# Patient Record
Sex: Female | Born: 1960 | Race: White | Hispanic: No | Marital: Married | Smoking: Never smoker
Health system: Southern US, Community
[De-identification: ages and names within clinical notes are randomized; demographics above are authoritative.]

## PROBLEM LIST (undated history)

## (undated) DIAGNOSIS — M720 Palmar fascial fibromatosis [Dupuytren]: Secondary | ICD-10-CM

## (undated) DIAGNOSIS — B3731 Acute candidiasis of vulva and vagina: Secondary | ICD-10-CM

## (undated) DIAGNOSIS — M1990 Unspecified osteoarthritis, unspecified site: Secondary | ICD-10-CM

## (undated) DIAGNOSIS — B373 Candidiasis of vulva and vagina: Secondary | ICD-10-CM

## (undated) HISTORY — DX: Candidiasis of vulva and vagina: B37.3

## (undated) HISTORY — PX: NO PAST SURGERIES: SHX2092

## (undated) HISTORY — DX: Acute candidiasis of vulva and vagina: B37.31

---

## 2001-01-11 ENCOUNTER — Other Ambulatory Visit: Admission: RE | Admit: 2001-01-11 | Discharge: 2001-01-11 | Payer: Self-pay | Admitting: Gynecology

## 2003-05-15 ENCOUNTER — Other Ambulatory Visit: Admission: RE | Admit: 2003-05-15 | Discharge: 2003-05-15 | Payer: Self-pay | Admitting: Gynecology

## 2004-10-10 ENCOUNTER — Other Ambulatory Visit: Admission: RE | Admit: 2004-10-10 | Discharge: 2004-10-10 | Payer: Self-pay | Admitting: Gynecology

## 2005-10-29 ENCOUNTER — Other Ambulatory Visit: Admission: RE | Admit: 2005-10-29 | Discharge: 2005-10-29 | Payer: Self-pay | Admitting: Gynecology

## 2006-11-04 ENCOUNTER — Other Ambulatory Visit: Admission: RE | Admit: 2006-11-04 | Discharge: 2006-11-04 | Payer: Self-pay | Admitting: Obstetrics and Gynecology

## 2007-11-25 ENCOUNTER — Other Ambulatory Visit: Admission: RE | Admit: 2007-11-25 | Discharge: 2007-11-25 | Payer: Self-pay | Admitting: Obstetrics and Gynecology

## 2007-11-25 ENCOUNTER — Ambulatory Visit: Payer: Self-pay | Admitting: Women's Health

## 2007-11-25 ENCOUNTER — Encounter: Payer: Self-pay | Admitting: Women's Health

## 2008-11-27 ENCOUNTER — Ambulatory Visit: Payer: Self-pay | Admitting: Women's Health

## 2008-11-27 ENCOUNTER — Other Ambulatory Visit: Admission: RE | Admit: 2008-11-27 | Discharge: 2008-11-27 | Payer: Self-pay | Admitting: Obstetrics and Gynecology

## 2009-12-05 ENCOUNTER — Other Ambulatory Visit: Admission: RE | Admit: 2009-12-05 | Discharge: 2009-12-05 | Payer: Self-pay | Admitting: Obstetrics and Gynecology

## 2009-12-05 ENCOUNTER — Ambulatory Visit: Payer: Self-pay | Admitting: Women's Health

## 2010-12-12 ENCOUNTER — Encounter: Payer: Self-pay | Admitting: Women's Health

## 2010-12-12 ENCOUNTER — Other Ambulatory Visit (HOSPITAL_COMMUNITY)
Admission: RE | Admit: 2010-12-12 | Discharge: 2010-12-12 | Disposition: A | Discharge status: Home / Self Care | Payer: BC Managed Care – PPO | Source: Ambulatory Visit | Attending: Gynecology | Admitting: Gynecology

## 2010-12-12 ENCOUNTER — Ambulatory Visit (INDEPENDENT_AMBULATORY_CARE_PROVIDER_SITE_OTHER): Payer: BC Managed Care – PPO | Admitting: Women's Health

## 2010-12-12 VITALS — BP 108/70 | Ht 65.75 in | Wt 132.0 lb

## 2010-12-12 DIAGNOSIS — Z01419 Encounter for gynecological examination (general) (routine) without abnormal findings: Secondary | ICD-10-CM

## 2010-12-12 DIAGNOSIS — Z23 Encounter for immunization: Secondary | ICD-10-CM

## 2010-12-12 DIAGNOSIS — N912 Amenorrhea, unspecified: Secondary | ICD-10-CM

## 2010-12-12 DIAGNOSIS — Z13220 Encounter for screening for lipoid disorders: Secondary | ICD-10-CM

## 2010-12-12 NOTE — Progress Notes (Signed)
Zada Haser Shewell Oct 27, 1960 [id number]    History:    The patient presents for annual exam.  [name] 21, at [employer], [place] 22 at [employer] Stay-at-home mom.   Past medical history, past surgical history, family history and social history were all reviewed and documented in the EPIC chart.   ROS:  A  ROS was performed and pertinent positives and negatives are included in the history.  Exam:  Filed Vitals:   12/12/10 1410  BP: 108/70    General appearance:  Normal Head/Neck:  Normal, without cervical or supraclavicular adenopathy. Thyroid:  Symmetrical, normal in size, without palpable masses or nodularity. Respiratory  Effort:  Normal  Auscultation:  Clear without wheezing or rhonchi Cardiovascular  Auscultation:  Regular rate, without rubs, murmurs or gallops  Edema/varicosities:  Not grossly evident Abdominal  Soft,nontender, without masses, guarding or rebound.  Liver/spleen:  No organomegaly noted  Hernia:  None appreciated  Skin  Inspection:  Grossly normal  Palpation:  Grossly normal Neurologic/psychiatric  Orientation:  Normal with appropriate conversation.  Mood/affect:  Normal  Genitourinary    Breasts: Examined lying and sitting.     Right: Without masses, retractions, discharge or axillary adenopathy.     Left: Without masses, retractions, discharge or axillary adenopathy.   Inguinal/mons:  Normal without inguinal adenopathy  External genitalia:  Normal  BUS/Urethra/Skene's glands:  Normal  Bladder:  Normal  Vagina:  Normal  Cervix:  Normal  Uterus:   normal in size, shape and contour.  Midline and mobile  Adnexa/parametria:     Rt: Without masses or tenderness.   Lt: Without masses or tenderness.  Anus and perineum: Normal  Digital rectal exam: Normal sphincter tone without palpated masses or tenderness  Assessment/Plan:  50 y.o. MWF G2 P2 for annual exam.  Had a 4-5d cycle in April, and Novembervasectomy. Year prior mostly monthly cycles. Minimal  menopausal symptoms other than states does not sleep well and is using Benadryl to help with rest. History of normal mammograms and Paps.  Perimenopausal  Plan: FSH, CBC, lipid profile, UA and Pap. Reviewed if not menopausal will use Provera to withdrawal a cycle spaces greater than 60 days. HRT reviewed declines need at this time. SBEs, continual annual screening, continue healthy exercise and lifestyle. Vitamin D 2000 encouraged. Flu shot given    Harrington Challenger [facility], 4:47 PM 12/12/2010

## 2011-03-28 ENCOUNTER — Encounter: Payer: Self-pay | Admitting: Women's Health

## 2012-01-21 ENCOUNTER — Ambulatory Visit (INDEPENDENT_AMBULATORY_CARE_PROVIDER_SITE_OTHER): Payer: BC Managed Care – PPO | Admitting: Women's Health

## 2012-01-21 ENCOUNTER — Encounter: Payer: Self-pay | Admitting: Women's Health

## 2012-01-21 VITALS — BP 104/70 | Ht 66.0 in | Wt 130.0 lb

## 2012-01-21 DIAGNOSIS — Z833 Family history of diabetes mellitus: Secondary | ICD-10-CM

## 2012-01-21 DIAGNOSIS — Z780 Asymptomatic menopausal state: Secondary | ICD-10-CM

## 2012-01-21 DIAGNOSIS — Z01419 Encounter for gynecological examination (general) (routine) without abnormal findings: Secondary | ICD-10-CM

## 2012-01-21 LAB — CBC WITH DIFFERENTIAL/PLATELET
Eosinophils Absolute: 0 10*3/uL (ref 0.0–0.7)
Eosinophils Relative: 0 % (ref 0–5)
HCT: 37 % (ref 36.0–46.0)
Hemoglobin: 12.4 g/dL (ref 12.0–15.0)
Lymphs Abs: 2.1 10*3/uL (ref 0.7–4.0)
MCH: 31.6 pg (ref 26.0–34.0)
MCV: 94.1 fL (ref 78.0–100.0)
Monocytes Relative: 8 % (ref 3–12)
RBC: 3.93 MIL/uL (ref 3.87–5.11)

## 2012-01-21 NOTE — Patient Instructions (Addendum)

## 2012-01-21 NOTE — Progress Notes (Signed)
Donna Gonzales January 03, 1961 [id number]    History:    The patient presents for annual exam.  Amenorrheic greater than one year with elevated FSH. No HRT. Vasectomy. History of normal Paps and mammograms.   Past medical history, past surgical history, family history and social history were all reviewed and documented in the EPIC chart. [name] 22 at [facility] state, [name] [address] both doing well. Substitute teacher.   ROS:  A  ROS was performed and pertinent positives and negatives are included in the history.  Exam:  Filed Vitals:   01/21/12 1408  BP: 104/70    General appearance:  Normal Head/Neck:  Normal, without cervical or supraclavicular adenopathy. Thyroid:  Symmetrical, normal in size, without palpable masses or nodularity. Respiratory  Effort:  Normal  Auscultation:  Clear without wheezing or rhonchi Cardiovascular  Auscultation:  Regular rate, without rubs, murmurs or gallops  Edema/varicosities:  Not grossly evident Abdominal  Soft,nontender, without masses, guarding or rebound.  Liver/spleen:  No organomegaly noted  Hernia:  None appreciated  Skin  Inspection:  Grossly normal  Palpation:  Grossly normal Neurologic/psychiatric  Orientation:  Normal with appropriate conversation.  Mood/affect:  Normal  Genitourinary    Breasts: Examined lying and sitting.     Right: Without masses, retractions, discharge or axillary adenopathy.     Left: Without masses, retractions, discharge or axillary adenopathy.   Inguinal/mons:  Normal without inguinal adenopathy  External genitalia:  Normal  BUS/Urethra/Skene's glands:  Normal  Bladder:  Normal  Vagina:  Normal  Cervix:  Normal  Uterus:   normal in size, shape and contour.  Midline and mobile  Adnexa/parametria:     Rt: Without masses or tenderness.   Lt: Without masses or tenderness.  Anus and perineum: Normal  Digital rectal exam: Normal sphincter tone without palpated masses or tenderness  Assessment/Plan:   52 y.o. M. WF G2 P2  for annual exam.  Normal postmenopausal exam with occasional hot flushes and poor sleep  Plan: HRT reviewed declines need at this time. SBE's, continue annual mammogram, continue healthy exercise routine, vitamin D 2000 daily encouraged. DEXA will schedule here. CBC, glucose, UA. Pap normal 2012, new screening guidelines reviewed.    Harrington Challenger [facility], 5:16 PM 01/21/2012

## 2012-01-22 LAB — URINALYSIS W MICROSCOPIC + REFLEX CULTURE
Bacteria, UA: NONE SEEN
Bilirubin Urine: NEGATIVE
Casts: NONE SEEN
Crystals: NONE SEEN
Ketones, ur: NEGATIVE mg/dL
Nitrite: NEGATIVE
Specific Gravity, Urine: 1.008 (ref 1.005–1.030)
Squamous Epithelial / LPF: NONE SEEN
pH: 6.5 (ref 5.0–8.0)

## 2012-03-02 ENCOUNTER — Other Ambulatory Visit: Payer: Self-pay | Admitting: Gynecology

## 2012-03-24 ENCOUNTER — Ambulatory Visit (INDEPENDENT_AMBULATORY_CARE_PROVIDER_SITE_OTHER): Payer: BC Managed Care – PPO

## 2012-03-31 ENCOUNTER — Encounter: Payer: Self-pay | Admitting: Women's Health

## 2012-09-29 ENCOUNTER — Ambulatory Visit
Admission: RE | Admit: 2012-09-29 | Discharge: 2012-09-29 | Disposition: A | Discharge status: Home / Self Care | Payer: BC Managed Care – PPO | Source: Ambulatory Visit | Attending: Chiropractic Medicine | Admitting: Chiropractic Medicine

## 2012-09-29 ENCOUNTER — Other Ambulatory Visit: Payer: Self-pay | Admitting: Chiropractic Medicine

## 2012-09-29 DIAGNOSIS — M542 Cervicalgia: Secondary | ICD-10-CM

## 2013-01-26 ENCOUNTER — Other Ambulatory Visit (HOSPITAL_COMMUNITY)
Admission: RE | Admit: 2013-01-26 | Discharge: 2013-01-26 | Disposition: A | Discharge status: Home / Self Care | Payer: BC Managed Care – PPO | Source: Ambulatory Visit | Attending: Gynecology | Admitting: Gynecology

## 2013-01-26 ENCOUNTER — Ambulatory Visit (INDEPENDENT_AMBULATORY_CARE_PROVIDER_SITE_OTHER): Payer: BC Managed Care – PPO | Admitting: Women's Health

## 2013-01-26 ENCOUNTER — Encounter: Payer: Self-pay | Admitting: Women's Health

## 2013-01-26 VITALS — BP 112/68 | Ht 65.85 in | Wt 127.8 lb

## 2013-01-26 DIAGNOSIS — Z13220 Encounter for screening for lipoid disorders: Secondary | ICD-10-CM

## 2013-01-26 DIAGNOSIS — Z01419 Encounter for gynecological examination (general) (routine) without abnormal findings: Secondary | ICD-10-CM

## 2013-01-26 DIAGNOSIS — Z833 Family history of diabetes mellitus: Secondary | ICD-10-CM

## 2013-01-26 LAB — CBC WITH DIFFERENTIAL/PLATELET
BASOS PCT: 0 % (ref 0–1)
Basophils Absolute: 0 10*3/uL (ref 0.0–0.1)
EOS ABS: 0 10*3/uL (ref 0.0–0.7)
EOS PCT: 0 % (ref 0–5)
HEMATOCRIT: 36.6 % (ref 36.0–46.0)
HEMOGLOBIN: 12.6 g/dL (ref 12.0–15.0)
LYMPHS ABS: 1.8 10*3/uL (ref 0.7–4.0)
Lymphocytes Relative: 26 % (ref 12–46)
MCH: 31.7 pg (ref 26.0–34.0)
MCHC: 34.4 g/dL (ref 30.0–36.0)
MCV: 92.2 fL (ref 78.0–100.0)
MONO ABS: 0.6 10*3/uL (ref 0.1–1.0)
MONOS PCT: 9 % (ref 3–12)
Neutro Abs: 4.3 10*3/uL (ref 1.7–7.7)
Neutrophils Relative %: 65 % (ref 43–77)
Platelets: 318 10*3/uL (ref 150–400)
RBC: 3.97 MIL/uL (ref 3.87–5.11)
RDW: 13.5 % (ref 11.5–15.5)
WBC: 6.8 10*3/uL (ref 4.0–10.5)

## 2013-01-26 NOTE — Addendum Note (Signed)
Addended by: Richardson ChiquitoWILKINSON, KARI S on: 01/26/2013 05:05 PM   Modules accepted: Orders

## 2013-01-26 NOTE — Patient Instructions (Signed)
Health Recommendations for Postmenopausal Women Respected and ongoing research has looked at the most common causes of death, disability, and poor quality of life in postmenopausal women. The causes include heart disease, diseases of blood vessels, diabetes, depression, cancer, and bone loss (osteoporosis). Many things can be done to help lower the chances of developing these and other common problems: CARDIOVASCULAR DISEASE Heart Disease: A heart attack is a medical emergency. Know the signs and symptoms of a heart attack. Below are things women can do to reduce their risk for heart disease.   Do not smoke. If you smoke, quit.  Aim for a healthy weight. Being overweight causes many preventable deaths. Eat a healthy and balanced diet and drink an adequate amount of liquids.  Get moving. Make a commitment to be more physically active. Aim for 30 minutes of activity on most, if not all days of the week.  Eat for heart health. Choose a diet that is low in saturated fat and cholesterol and eliminate trans fat. Include whole grains, vegetables, and fruits. Read and understand the labels on food containers before buying.  Know your numbers. Ask your caregiver to check your blood pressure, cholesterol (total, HDL, LDL, triglycerides) and blood glucose. Work with your caregiver on improving your entire clinical picture.  High blood pressure. Limit or stop your table salt intake (try salt substitute and food seasonings). Avoid salty foods and drinks. Read labels on food containers before buying. Eating well and exercising can help control high blood pressure. STROKE  Stroke is a medical emergency. Stroke may be the result of a blood clot in a blood vessel in the brain or by a brain hemorrhage (bleeding). Know the signs and symptoms of a stroke. To lower the risk of developing a stroke:  Avoid fatty foods.  Quit smoking.  Control your diabetes, blood pressure, and irregular heart rate. THROMBOPHLEBITIS  (BLOOD CLOT) OF THE LEG  Becoming overweight and leading a stationary lifestyle may also contribute to developing blood clots. Controlling your diet and exercising will help lower the risk of developing blood clots. CANCER SCREENING  Breast Cancer: Take steps to reduce your risk of breast cancer.  You should practice "breast self-awareness." This means understanding the normal appearance and feel of your breasts and should include breast self-examination. Any changes detected, no matter how small, should be reported to your caregiver.  After age 40, you should have a clinical breast exam (CBE) every year.  Starting at age 40, you should consider having a mammogram (breast X-ray) every year.  If you have a family history of breast cancer, talk to your caregiver about genetic screening.  If you are at high risk for breast cancer, talk to your caregiver about having an MRI and a mammogram every year.  Intestinal or Stomach Cancer: Tests to consider are a rectal exam, fecal occult blood, sigmoidoscopy, and colonoscopy. Women who are high risk may need to be screened at an earlier age and more often.  Cervical Cancer:  Beginning at age 30, you should have a Pap test every 3 years as long as the past 3 Pap tests have been normal.  If you have had past treatment for cervical cancer or a condition that could lead to cancer, you need Pap tests and screening for cancer for at least 20 years after your treatment.  If you had a hysterectomy for a problem that was not cancer or a condition that could lead to cancer, then you no longer need Pap tests.    If you are between ages 65 and 70, and you have had normal Pap tests going back 10 years, you no longer need Pap tests.  If Pap tests have been discontinued, risk factors (such as a new sexual partner) need to be reassessed to determine if screening should be resumed.  Some medical problems can increase the chance of getting cervical cancer. In these  cases, your caregiver may recommend more frequent screening and Pap tests.  Uterine Cancer: If you have vaginal bleeding after reaching menopause, you should notify your caregiver.  Ovarian cancer: Other than yearly pelvic exams, there are no reliable tests available to screen for ovarian cancer at this time except for yearly pelvic exams.  Lung Cancer: Yearly chest X-rays can detect lung cancer and should be done on high risk women, such as cigarette smokers and women with chronic lung disease (emphysema).  Skin Cancer: A complete body skin exam should be done at your yearly examination. Avoid overexposure to the sun and ultraviolet light lamps. Use a strong sun block cream when in the sun. All of these things are important in lowering the risk of skin cancer. MENOPAUSE Menopause Symptoms: Hormone therapy products are effective for treating symptoms associated with menopause:  Moderate to severe hot flashes.  Night sweats.  Mood swings.  Headaches.  Tiredness.  Loss of sex drive.  Insomnia.  Other symptoms. Hormone replacement carries certain risks, especially in older women. Women who use or are thinking about using estrogen or estrogen with progestin treatments should discuss that with their caregiver. Your caregiver will help you understand the benefits and risks. The ideal dose of hormone replacement therapy is not known. The Food and Drug Administration (FDA) has concluded that hormone therapy should be used only at the lowest doses and for the shortest amount of time to reach treatment goals.  OSTEOPOROSIS Protecting Against Bone Loss and Preventing Fracture: If you use hormone therapy for prevention of bone loss (osteoporosis), the risks for bone loss must outweigh the risk of the therapy. Ask your caregiver about other medications known to be safe and effective for preventing bone loss and fractures. To guard against bone loss or fractures, the following is recommended:  If  you are less than age 50, take 1000 mg of calcium and at least 600 mg of Vitamin D per day.  If you are greater than age 50 but less than age 70, take 1200 mg of calcium and at least 600 mg of Vitamin D per day.  If you are greater than age 70, take 1200 mg of calcium and at least 800 mg of Vitamin D per day. Smoking and excessive alcohol intake increases the risk of osteoporosis. Eat foods rich in calcium and vitamin D and do weight bearing exercises several times a week as your caregiver suggests. DIABETES Diabetes Melitus: If you have Type I or Type 2 diabetes, you should keep your blood sugar under control with diet, exercise and recommended medication. Avoid too many sweets, starchy and fatty foods. Being overweight can make control more difficult. COGNITION AND MEMORY Cognition and Memory: Menopausal hormone therapy is not recommended for the prevention of cognitive disorders such as Alzheimer's disease or memory loss.  DEPRESSION  Depression may occur at any age, but is common in elderly women. The [place] may be because of physical, medical, social (loneliness), or financial problems and needs. If you are experiencing depression because of medical problems and control of symptoms, talk to your caregiver about this. Physical activity and   exercise may help with mood and sleep. Community and volunteer involvement may help your sense of value and worth. If you have depression and you feel that the problem is getting worse or becoming severe, talk to your caregiver about treatment options that are best for you. ACCIDENTS  Accidents are common and can be serious in the elderly woman. Prepare your house to prevent accidents. Eliminate throw rugs, place hand bars in the bath, shower and toilet areas. Avoid wearing high heeled shoes or walking on wet, snowy, and icy areas. Limit or stop driving if you have vision or hearing problems, or you feel you are unsteady with you movements and  reflexes. HEPATITIS C Hepatitis C is a type of viral infection affecting the liver. It is spread mainly through contact with blood from an infected person. It can be treated, but if left untreated, it can lead to severe liver damage over years. Many people who are infected do not know that the virus is in their blood. If you are a "baby-boomer", it is recommended that you have one screening test for Hepatitis C. IMMUNIZATIONS  Several immunizations are important to consider having during your senior years, including:   Tetanus, diptheria, and pertussis booster shot.  Influenza every year before the flu season begins.  Pneumonia vaccine.  Shingles vaccine.  Others as indicated based on your specific needs. Talk to your caregiver about these. Document Released: 02/14/2005 Document Revised: 12/10/2011 Document Reviewed: 10/11/2007 ExitCare Patient Information 2014 [employer].  

## 2013-01-26 NOTE — Progress Notes (Addendum)
Donna BiblesCynthia K Gonzales June 25, 1960 [id number]    History:    Presents for annual exam.  Menopausal/ no bleeding/no HRT. Normal Pap and mammogram history. Normal DEXA 02/2012 T score hip average 0.5.  Past medical history, past surgical history, family history and social history were all reviewed and documented in the EPIC chart. 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. [place] a trip to [place] for 2 weeks. Father heart disease.  ROS:  A  ROS was performed and pertinent positives and negatives are included.  Exam:  Filed Vitals:   01/26/13 1403  BP: 112/68    General appearance:  Normal Thyroid:  Symmetrical, normal in size, without palpable masses or nodularity. Respiratory  Auscultation:  Clear without wheezing or rhonchi Cardiovascular  Auscultation:  Regular rate, without rubs, murmurs or gallops  Edema/varicosities:  Not grossly evident Abdominal  Soft,nontender, without masses, guarding or rebound.  Liver/spleen:  No organomegaly noted  Hernia:  None appreciated  Skin  Inspection:  Grossly normal   Breasts: Examined lying and sitting.     Right: Without masses, retractions, discharge or axillary adenopathy.     Left: Without masses, retractions, discharge or axillary adenopathy. Gentitourinary   Inguinal/mons:  Normal without inguinal adenopathy  External genitalia:  Normal  BUS/Urethra/Skene's glands:  Normal  Vagina:  Normal  Cervix:  Normal  Uterus:   normal in size, shape and contour.  Midline and mobile  Adnexa/parametria:     Rt: Without masses or tenderness.   Lt: Without masses or tenderness.  Anus and perineum: Normal  Digital rectal exam: Normal sphincter tone without palpated masses or tenderness  Assessment/Plan:  53 y.o. MWF G2P2 for annual exam with no complaints.  Postmenopausal/no HRT/no bleeding Normal DEXA  Plan: SBE's, continue annual mammogram, 3-D tomography reviewed and  encouraged history of dense breast. Continue active lifestyle, regular exercise, healthy diet, vitamin D 2000 daily encouraged. CBC, glucose, lipid panel, UA, Pap. Pap normal 2012, new screening guidelines reviewed. Instructed to schedule screening colonoscopy,  Dr. Loreta AveMann or Baird LyonsLebaurer Dartha LodgeGI.    YOUNG,NANCY J [facility] HospitalWHNP, 3:05 PM 01/26/2013

## 2013-01-27 LAB — LIPID PANEL
CHOL/HDL RATIO: 2 ratio
Cholesterol: 211 mg/dL — ABNORMAL HIGH (ref 0–200)
HDL: 103 mg/dL (ref >39)
LDL Cholesterol: 96 mg/dL (ref 0–99)
Triglycerides: 61 mg/dL (ref <150)
VLDL: 12 mg/dL (ref 0–40)

## 2013-01-27 LAB — URINALYSIS W MICROSCOPIC + REFLEX CULTURE
Bacteria, UA: NONE SEEN
Bilirubin Urine: NEGATIVE
Casts: NONE SEEN
GLUCOSE, UA: NEGATIVE mg/dL
Hgb urine dipstick: NEGATIVE
LEUKOCYTES UA: NEGATIVE
Nitrite: NEGATIVE
PROTEIN: NEGATIVE mg/dL
Specific Gravity, Urine: 1.024 (ref 1.005–1.030)
Urobilinogen, UA: 0.2 mg/dL (ref 0.0–1.0)
pH: 6.5 (ref 5.0–8.0)

## 2013-01-27 LAB — GLUCOSE, RANDOM: Glucose, Bld: 79 mg/dL (ref 70–99)

## 2013-03-31 ENCOUNTER — Encounter: Payer: Self-pay | Admitting: Women's Health

## 2013-11-07 ENCOUNTER — Encounter: Payer: Self-pay | Admitting: Women's Health

## 2014-01-31 ENCOUNTER — Ambulatory Visit (INDEPENDENT_AMBULATORY_CARE_PROVIDER_SITE_OTHER): Payer: BLUE CROSS/BLUE SHIELD | Admitting: Women's Health

## 2014-01-31 ENCOUNTER — Encounter: Payer: Self-pay | Admitting: Women's Health

## 2014-01-31 VITALS — BP 130/80 | Ht 65.0 in | Wt 127.0 lb

## 2014-01-31 DIAGNOSIS — G4700 Insomnia, unspecified: Secondary | ICD-10-CM

## 2014-01-31 DIAGNOSIS — Z13220 Encounter for screening for lipoid disorders: Secondary | ICD-10-CM

## 2014-01-31 DIAGNOSIS — Z01419 Encounter for gynecological examination (general) (routine) without abnormal findings: Secondary | ICD-10-CM

## 2014-01-31 LAB — COMPREHENSIVE METABOLIC PANEL
ALT: 18 U/L (ref 0–35)
AST: 33 U/L (ref 0–37)
Albumin: 4.7 g/dL (ref 3.5–5.2)
Alkaline Phosphatase: 34 U/L — ABNORMAL LOW (ref 39–117)
BUN: 14 mg/dL (ref 6–23)
CALCIUM: 9.9 mg/dL (ref 8.4–10.5)
CHLORIDE: 103 meq/L (ref 96–112)
CO2: 28 mEq/L (ref 19–32)
CREATININE: 0.72 mg/dL (ref 0.50–1.10)
Glucose, Bld: 88 mg/dL (ref 70–99)
Potassium: 5.2 mEq/L (ref 3.5–5.3)
SODIUM: 141 meq/L (ref 135–145)
Total Bilirubin: 1.4 mg/dL — ABNORMAL HIGH (ref 0.2–1.2)
Total Protein: 7.5 g/dL (ref 6.0–8.3)

## 2014-01-31 LAB — LIPID PANEL
CHOL/HDL RATIO: 2.2 ratio
CHOLESTEROL: 235 mg/dL — AB (ref 0–200)
HDL: 109 mg/dL (ref >39)
LDL CALC: 102 mg/dL — AB (ref 0–99)
TRIGLYCERIDES: 121 mg/dL (ref <150)
VLDL: 24 mg/dL (ref 0–40)

## 2014-01-31 LAB — CBC WITH DIFFERENTIAL/PLATELET
Basophils Absolute: 0 10*3/uL (ref 0.0–0.1)
Basophils Relative: 0 % (ref 0–1)
EOS PCT: 1 % (ref 0–5)
Eosinophils Absolute: 0.1 10*3/uL (ref 0.0–0.7)
HCT: 38.3 % (ref 36.0–46.0)
HEMOGLOBIN: 12.8 g/dL (ref 12.0–15.0)
Lymphocytes Relative: 25 % (ref 12–46)
Lymphs Abs: 1.9 10*3/uL (ref 0.7–4.0)
MCH: 31.7 pg (ref 26.0–34.0)
MCHC: 33.4 g/dL (ref 30.0–36.0)
MCV: 94.8 fL (ref 78.0–100.0)
MONO ABS: 0.5 10*3/uL (ref 0.1–1.0)
MPV: 9.6 fL (ref 8.6–12.4)
Monocytes Relative: 7 % (ref 3–12)
Neutro Abs: 5 10*3/uL (ref 1.7–7.7)
Neutrophils Relative %: 67 % (ref 43–77)
Platelets: 283 10*3/uL (ref 150–400)
RBC: 4.04 MIL/uL (ref 3.87–5.11)
RDW: 13.3 % (ref 11.5–15.5)
WBC: 7.4 10*3/uL (ref 4.0–10.5)

## 2014-01-31 LAB — TSH: TSH: 2.304 u[IU]/mL (ref 0.350–4.500)

## 2014-01-31 MED ORDER — ZOLPIDEM TARTRATE 10 MG PO TABS: 10.0000 mg | ORAL_TABLET | Freq: Every evening | ORAL | Status: DC | PRN

## 2014-01-31 NOTE — Patient Instructions (Signed)
Health Recommendations for Postmenopausal Women Respected and ongoing research has looked at the most common causes of death, disability, and poor quality of life in postmenopausal women. The causes include heart disease, diseases of blood vessels, diabetes, depression, cancer, and bone loss (osteoporosis). Many things can be done to help lower the chances of developing these and other common problems. CARDIOVASCULAR DISEASE Heart Disease: A heart attack is a medical emergency. Know the signs and symptoms of a heart attack. Below are things women can do to reduce their risk for heart disease.   Do not smoke. If you smoke, quit.  Aim for a healthy weight. Being overweight causes many preventable deaths. Eat a healthy and balanced diet and drink an adequate amount of liquids.  Get moving. Make a commitment to be more physically active. Aim for 30 minutes of activity on most, if not all days of the week.  Eat for heart health. Choose a diet that is low in saturated fat and cholesterol and eliminate trans fat. Include whole grains, vegetables, and fruits. Read and understand the labels on food containers before buying.  Know your numbers. Ask your caregiver to check your blood pressure, cholesterol (total, HDL, LDL, triglycerides) and blood glucose. Work with your caregiver on improving your entire clinical picture.  High blood pressure. Limit or stop your table salt intake (try salt substitute and food seasonings). Avoid salty foods and drinks. Read labels on food containers before buying. Eating well and exercising can help control high blood pressure. STROKE  Stroke is a medical emergency. Stroke may be the result of a blood clot in a blood vessel in the brain or by a brain hemorrhage (bleeding). Know the signs and symptoms of a stroke. To lower the risk of developing a stroke:  Avoid fatty foods.  Quit smoking.  Control your diabetes, blood pressure, and irregular heart rate. THROMBOPHLEBITIS  (BLOOD CLOT) OF THE LEG  Becoming overweight and leading a stationary lifestyle may also contribute to developing blood clots. Controlling your diet and exercising will help lower the risk of developing blood clots. CANCER SCREENING  Breast Cancer: Take steps to reduce your risk of breast cancer.  You should practice "breast self-awareness." This means understanding the normal appearance and feel of your breasts and should include breast self-examination. Any changes detected, no matter how small, should be reported to your caregiver.  After age 40, you should have a clinical breast exam (CBE) every year.  Starting at age 40, you should consider having a mammogram (breast X-ray) every year.  If you have a family history of breast cancer, talk to your caregiver about genetic screening.  If you are at high risk for breast cancer, talk to your caregiver about having an MRI and a mammogram every year.  Intestinal or Stomach Cancer: Tests to consider are a rectal exam, fecal occult blood, sigmoidoscopy, and colonoscopy. Women who are high risk may need to be screened at an earlier age and more often.  Cervical Cancer:  Beginning at age 30, you should have a Pap test every 3 years as long as the past 3 Pap tests have been normal.  If you have had past treatment for cervical cancer or a condition that could lead to cancer, you need Pap tests and screening for cancer for at least 20 years after your treatment.  If you had a hysterectomy for a problem that was not cancer or a condition that could lead to cancer, then you no longer need Pap tests.    If you are between ages 65 and 70, and you have had normal Pap tests going back 10 years, you no longer need Pap tests.  If Pap tests have been discontinued, risk factors (such as a new sexual partner) need to be reassessed to determine if screening should be resumed.  Some medical problems can increase the chance of getting cervical cancer. In these  cases, your caregiver may recommend more frequent screening and Pap tests.  Uterine Cancer: If you have vaginal bleeding after reaching menopause, you should notify your caregiver.  Ovarian Cancer: Other than yearly pelvic exams, there are no reliable tests available to screen for ovarian cancer at this time except for yearly pelvic exams.  Lung Cancer: Yearly chest X-rays can detect lung cancer and should be done on high risk women, such as cigarette smokers and women with chronic lung disease (emphysema).  Skin Cancer: A complete body skin exam should be done at your yearly examination. Avoid overexposure to the sun and ultraviolet light lamps. Use a strong sun block cream when in the sun. All of these things are important for lowering the risk of skin cancer. MENOPAUSE Menopause Symptoms: Hormone therapy products are effective for treating symptoms associated with menopause:  Moderate to severe hot flashes.  Night sweats.  Mood swings.  Headaches.  Tiredness.  Loss of sex drive.  Insomnia.  Other symptoms. Hormone replacement carries certain risks, especially in older women. Women who use or are thinking about using estrogen or estrogen with progestin treatments should discuss that with their caregiver. Your caregiver will help you understand the benefits and risks. The ideal dose of hormone replacement therapy is not known. The Food and Drug Administration (FDA) has concluded that hormone therapy should be used only at the lowest doses and for the shortest amount of time to reach treatment goals.  OSTEOPOROSIS Protecting Against Bone Loss and Preventing Fracture If you use hormone therapy for prevention of bone loss (osteoporosis), the risks for bone loss must outweigh the risk of the therapy. Ask your caregiver about other medications known to be safe and effective for preventing bone loss and fractures. To guard against bone loss or fractures, the following is recommended:  If  you are younger than age 50, take 1000 mg of calcium and at least 600 mg of Vitamin D per day.  If you are older than age 50 but younger than age 70, take 1200 mg of calcium and at least 600 mg of Vitamin D per day.  If you are older than age 70, take 1200 mg of calcium and at least 800 mg of Vitamin D per day. Smoking and excessive alcohol intake increases the risk of osteoporosis. Eat foods rich in calcium and vitamin D and do weight bearing exercises several times a week as your caregiver suggests. DIABETES Diabetes Mellitus: If you have type I or type 2 diabetes, you should keep your blood sugar under control with diet, exercise, and recommended medication. Avoid starchy and fatty foods, and too many sweets. Being overweight can make diabetes control more difficult. COGNITION AND MEMORY Cognition and Memory: Menopausal hormone therapy is not recommended for the prevention of cognitive disorders such as Alzheimer's disease or memory loss.  DEPRESSION  Depression may occur at any age, but it is common in elderly women. This may be because of physical, medical, social (loneliness), or financial problems and needs. If you are experiencing depression because of medical problems and control of symptoms, talk to your caregiver about this. Physical   activity and exercise may help with mood and sleep. Community and volunteer involvement may improve your sense of value and worth. If you have depression and you feel that the problem is getting worse or becoming severe, talk to your caregiver about which treatment options are best for you. ACCIDENTS  Accidents are common and can be serious in elderly woman. Prepare your house to prevent accidents. Eliminate throw rugs, place hand bars in bath, shower, and toilet areas. Avoid wearing high heeled shoes or walking on wet, snowy, and icy areas. Limit or stop driving if you have vision or hearing problems, or if you feel you are unsteady with your movements and  reflexes. HEPATITIS C Hepatitis C is a type of viral infection affecting the liver. It is spread mainly through contact with blood from an infected person. It can be treated, but if left untreated, it can lead to severe liver damage over the years. Many people who are infected do not know that the virus is in their blood. If you are a "baby-boomer", it is recommended that you have one screening test for Hepatitis C. IMMUNIZATIONS  Several immunizations are important to consider having during your senior years, including:   Tetanus, diphtheria, and pertussis booster shot.  Influenza every year before the flu season begins.  Pneumonia vaccine.  Shingles vaccine.  Others, as indicated based on your specific needs. Talk to your caregiver about these. Document Released: 02/14/2005 Document Revised: 05/09/2013 Document Reviewed: 10/11/2007 [employer] Patient Information 2015 [employer]. This information is not intended to replace advice given to you by your health care provider. Make sure you discuss any questions you have with your health care provider.  

## 2014-01-31 NOTE — Progress Notes (Signed)
Donna BiblesCynthia K Gonzales 10/27/1960 [id number]    History:    Presents for annual exam.  Postmenopausal/no bleeding/ no HRT. Normal Pap and mammogram history. 2014 normal DEXA T score 0.5 at hip. Has not had a colonoscopy.  Past medical history, past surgical history, family history and social history were all reviewed and documented in the EPIC chart. Homemaker. Donna Gonzales working in [place] doing well, Donna OilMeghan graduating in May starting job in [place]. Mother healthy, father heart disease deceased.  ROS:  A ROS was performed and pertinent positives and negatives are included.  Exam:  Filed Vitals:   01/31/14 1410  BP: 130/80    General appearance:  Normal Thyroid:  Symmetrical, normal in size, without palpable masses or nodularity. Respiratory  Auscultation:  Clear without wheezing or rhonchi Cardiovascular  Auscultation:  Regular rate, without rubs, murmurs or gallops  Edema/varicosities:  Not grossly evident Abdominal  Soft,nontender, without masses, guarding or rebound.  Liver/spleen:  No organomegaly noted  Hernia:  None appreciated  Skin  Inspection:  Grossly normal   Breasts: Examined lying and sitting.     Right: Without masses, retractions, discharge or axillary adenopathy.     Left: Without masses, retractions, discharge or axillary adenopathy. Gentitourinary   Inguinal/mons:  Normal without inguinal adenopathy  External genitalia:  Normal  BUS/Urethra/Skene's glands:  Normal  Vagina:  Normal  Cervix:  Normal  Uterus:  normal in size, shape and contour.  Midline and mobile  Adnexa/parametria:     Rt: Without masses or tenderness.   Lt: Without masses or tenderness.  Anus and perineum: Normal  Digital rectal exam: Normal sphincter tone without palpated masses or tenderness  Assessment/Plan:  54 y.o. MWF G2P2 for annual exam with no complaints.  Postmenopausal/no HRT/no bleeding  Plan: SBE's, continue annual screening 3-D mammogram, history of dense breasts. Continue  regular exercise and healthy lifestyle, vitamin D 1000 daily encouraged. CBC, CMP, lipid panel, vitamin D, UA, Pap normal 2015, new screening guidelines reviewed.  Donna ChallengerYOUNG,Donna Gonzales WHNP, 5:30 PM 01/31/2014

## 2014-02-01 LAB — URINALYSIS W MICROSCOPIC + REFLEX CULTURE
BILIRUBIN URINE: NEGATIVE
Bacteria, UA: NONE SEEN
CASTS: NONE SEEN
Crystals: NONE SEEN
GLUCOSE, UA: NEGATIVE mg/dL
Hgb urine dipstick: NEGATIVE
Ketones, ur: NEGATIVE mg/dL
Nitrite: NEGATIVE
Protein, ur: NEGATIVE mg/dL
Specific Gravity, Urine: 1.006 (ref 1.005–1.030)
Squamous Epithelial / LPF: NONE SEEN
Urobilinogen, UA: 0.2 mg/dL (ref 0.0–1.0)
pH: 7 (ref 5.0–8.0)

## 2014-02-01 LAB — VITAMIN D 25 HYDROXY (VIT D DEFICIENCY, FRACTURES): VIT D 25 HYDROXY: 39 ng/mL (ref 30–100)

## 2014-02-02 LAB — URINE CULTURE
Colony Count: NO GROWTH
ORGANISM ID, BACTERIA: NO GROWTH

## 2014-05-05 ENCOUNTER — Encounter: Payer: Self-pay | Admitting: Women's Health

## 2014-05-09 ENCOUNTER — Encounter: Payer: Self-pay | Admitting: Women's Health

## 2014-07-06 ENCOUNTER — Telehealth: Payer: Self-pay | Admitting: *Deleted

## 2014-07-06 DIAGNOSIS — G4700 Insomnia, unspecified: Secondary | ICD-10-CM

## 2014-07-06 NOTE — Telephone Encounter (Signed)
Pt called requesting refill on Ambien 10 mg #30 last filled in Jan 2016. Pt said insurance will only pay for #15 tablets at a time, I did call pharmacy to confirm only #15 given on 02/01/14. So I will call in remaining #15 tablets from Jan 2016 visit.. I just wanted you to know I was going to do this.

## 2014-07-06 NOTE — Telephone Encounter (Signed)
Thank you :)

## 2014-07-07 MED ORDER — ZOLPIDEM TARTRATE 10 MG PO TABS: 10.0000 mg | ORAL_TABLET | Freq: Every evening | ORAL | Status: DC | PRN

## 2014-07-07 NOTE — Telephone Encounter (Signed)
Rx called in 

## 2014-12-18 ENCOUNTER — Encounter: Payer: Self-pay | Admitting: Internal Medicine

## 2015-02-07 ENCOUNTER — Encounter: Payer: Self-pay | Admitting: Women's Health

## 2015-02-07 ENCOUNTER — Ambulatory Visit (INDEPENDENT_AMBULATORY_CARE_PROVIDER_SITE_OTHER): Payer: BLUE CROSS/BLUE SHIELD | Admitting: Women's Health

## 2015-02-07 VITALS — BP 115/76 | Ht 66.0 in | Wt 125.2 lb

## 2015-02-07 DIAGNOSIS — Z8619 Personal history of other infectious and parasitic diseases: Secondary | ICD-10-CM | POA: Diagnosis not present

## 2015-02-07 DIAGNOSIS — Z13220 Encounter for screening for lipoid disorders: Secondary | ICD-10-CM | POA: Diagnosis not present

## 2015-02-07 DIAGNOSIS — G4700 Insomnia, unspecified: Secondary | ICD-10-CM

## 2015-02-07 DIAGNOSIS — Z01419 Encounter for gynecological examination (general) (routine) without abnormal findings: Secondary | ICD-10-CM

## 2015-02-07 LAB — CBC WITH DIFFERENTIAL/PLATELET
BASOS ABS: 0.1 10*3/uL (ref 0.0–0.1)
Basophils Relative: 1 % (ref 0–1)
Eosinophils Absolute: 0.1 10*3/uL (ref 0.0–0.7)
Eosinophils Relative: 1 % (ref 0–5)
HEMATOCRIT: 37.4 % (ref 36.0–46.0)
HEMOGLOBIN: 12.6 g/dL (ref 12.0–15.0)
Lymphocytes Relative: 33 % (ref 12–46)
Lymphs Abs: 2.1 10*3/uL (ref 0.7–4.0)
MCH: 32.1 pg (ref 26.0–34.0)
MCHC: 33.7 g/dL (ref 30.0–36.0)
MCV: 95.2 fL (ref 78.0–100.0)
MONO ABS: 0.5 10*3/uL (ref 0.1–1.0)
MONOS PCT: 8 % (ref 3–12)
MPV: 9.6 fL (ref 8.6–12.4)
NEUTROS PCT: 57 % (ref 43–77)
Neutro Abs: 3.6 10*3/uL (ref 1.7–7.7)
Platelets: 286 10*3/uL (ref 150–400)
RBC: 3.93 MIL/uL (ref 3.87–5.11)
RDW: 13.3 % (ref 11.5–15.5)
WBC: 6.4 10*3/uL (ref 4.0–10.5)

## 2015-02-07 LAB — COMPREHENSIVE METABOLIC PANEL
ALBUMIN: 4.8 g/dL (ref 3.6–5.1)
ALK PHOS: 35 U/L (ref 33–130)
ALT: 21 U/L (ref 6–29)
AST: 34 U/L (ref 10–35)
BILIRUBIN TOTAL: 1.4 mg/dL — AB (ref 0.2–1.2)
BUN: 16 mg/dL (ref 7–25)
CO2: 27 mmol/L (ref 20–31)
Calcium: 9.3 mg/dL (ref 8.6–10.4)
Chloride: 101 mmol/L (ref 98–110)
Creat: 0.9 mg/dL (ref 0.50–1.05)
Glucose, Bld: 90 mg/dL (ref 65–99)
POTASSIUM: 3.8 mmol/L (ref 3.5–5.3)
Sodium: 139 mmol/L (ref 135–146)
TOTAL PROTEIN: 7.4 g/dL (ref 6.1–8.1)

## 2015-02-07 LAB — LIPID PANEL
CHOL/HDL RATIO: 1.7 ratio (ref <=5.0)
CHOLESTEROL: 242 mg/dL — AB (ref 125–200)
HDL: 140 mg/dL (ref >=46)
LDL Cholesterol: 90 mg/dL (ref <130)
TRIGLYCERIDES: 61 mg/dL (ref <150)
VLDL: 12 mg/dL (ref <30)

## 2015-02-07 MED ORDER — ZOLPIDEM TARTRATE 10 MG PO TABS: 10.0000 mg | ORAL_TABLET | Freq: Every evening | ORAL | Status: DC | PRN

## 2015-02-07 NOTE — Patient Instructions (Signed)
Menopause is a normal process in which your reproductive ability comes to an end. This process happens gradually over a span of months to years, usually between the ages of 48 and 55. Menopause is complete when you have missed 12 consecutive menstrual periods. It is important to talk with your health care provider about some of the most common conditions that affect postmenopausal women, such as heart disease, cancer, and bone loss (osteoporosis). Adopting a healthy lifestyle and getting preventive care can help to promote your health and wellness. Those actions can also lower your chances of developing some of these common conditions. WHAT SHOULD I KNOW ABOUT MENOPAUSE? During menopause, you may experience a number of symptoms, such as:  Moderate-to-severe hot flashes.  Night sweats.  Decrease in sex drive.  Mood swings.  Headaches.  Tiredness.  Irritability.  Memory problems.  Insomnia. Choosing to treat or not to treat menopausal changes is an individual decision that you make with your health care provider. WHAT SHOULD I KNOW ABOUT HORMONE REPLACEMENT THERAPY AND SUPPLEMENTS? Hormone therapy products are effective for treating symptoms that are associated with menopause, such as hot flashes and night sweats. Hormone replacement carries certain risks, especially as you become older. If you are thinking about using estrogen or estrogen with progestin treatments, discuss the benefits and risks with your health care provider. WHAT SHOULD I KNOW ABOUT HEART DISEASE AND STROKE? Heart disease, heart attack, and stroke become more likely as you age. This may be due, in part, to the hormonal changes that your body experiences during menopause. These can affect how your body processes dietary fats, triglycerides, and cholesterol. Heart attack and stroke are both medical emergencies. There are many things that you can do to help prevent heart disease and stroke:  Have your blood pressure  checked at least every 1-2 years. High blood pressure causes heart disease and increases the risk of stroke.  If you are 55-79 years old, ask your health care provider if you should take aspirin to prevent a heart attack or a stroke.  Do not use any tobacco products, including cigarettes, chewing tobacco, or electronic cigarettes. If you need help quitting, ask your health care provider.  It is important to eat a healthy diet and maintain a healthy weight.  Be sure to include plenty of vegetables, fruits, low-fat dairy products, and lean protein.  Avoid eating foods that are high in solid fats, added sugars, or salt (sodium).  Get regular exercise. This is one of the most important things that you can do for your health.  Try to exercise for at least 150 minutes each week. The type of exercise that you do should increase your heart rate and make you sweat. This is known as moderate-intensity exercise.  Try to do strengthening exercises at least twice each week. Do these in addition to the moderate-intensity exercise.  Know your numbers.Ask your health care provider to check your cholesterol and your blood glucose. Continue to have your blood tested as directed by your health care provider. WHAT SHOULD I KNOW ABOUT CANCER SCREENING? There are several types of cancer. Take the following steps to reduce your risk and to catch any cancer development as early as possible. Breast Cancer  Practice breast self-awareness.  This means understanding how your breasts normally appear and feel.  It also means doing regular breast self-exams. Let your health care provider know about any changes, no matter how small.  If you are 40 or older, have a clinician do a   breast exam (clinical breast exam or CBE) every year. Depending on your age, family history, and medical history, it may be recommended that you also have a yearly breast X-ray (mammogram).  If you have a family history of breast cancer,  talk with your health care provider about genetic screening.  If you are at high risk for breast cancer, talk with your health care provider about having an MRI and a mammogram every year.  Breast cancer (BRCA) gene test is recommended for women who have family members with BRCA-related cancers. Results of the assessment will determine the need for genetic counseling and BRCA1 and for BRCA2 testing. BRCA-related cancers include these types:  Breast. This occurs in males or females.  Ovarian.  Tubal. This may also be called fallopian tube cancer.  Cancer of the abdominal or pelvic lining (peritoneal cancer).  Prostate.  Pancreatic. Cervical, Uterine, and Ovarian Cancer Your health care provider may recommend that you be screened regularly for cancer of the pelvic organs. These include your ovaries, uterus, and vagina. This screening involves a pelvic exam, which includes checking for microscopic changes to the surface of your cervix (Pap test).  For women ages 21-65, health care providers may recommend a pelvic exam and a Pap test every three years. For women ages 24-65, they may recommend the Pap test and pelvic exam, combined with testing for human papilloma virus (HPV), every five years. Some types of HPV increase your risk of cervical cancer. Testing for HPV may also be done on women of any age who have unclear Pap test results.  Other health care providers may not recommend any screening for nonpregnant women who are considered low risk for pelvic cancer and have no symptoms. Ask your health care provider if a screening pelvic exam is right for you.  If you have had past treatment for cervical cancer or a condition that could lead to cancer, you need Pap tests and screening for cancer for at least 20 years after your treatment. If Pap tests have been discontinued for you, your risk factors (such as having a new sexual partner) need to be reassessed to determine if you should start having  screenings again. Some women have medical problems that increase the chance of getting cervical cancer. In these cases, your health care provider may recommend that you have screening and Pap tests more often.  If you have a family history of uterine cancer or ovarian cancer, talk with your health care provider about genetic screening.  If you have vaginal bleeding after reaching menopause, tell your health care provider.  There are currently no reliable tests available to screen for ovarian cancer. Lung Cancer Lung cancer screening is recommended for adults 35-6 years old who are at high risk for lung cancer because of a history of smoking. A yearly low-dose CT scan of the lungs is recommended if you:  Currently smoke.  Have a history of at least 30 pack-years of smoking and you currently smoke or have quit within the past 15 years. A pack-year is smoking an average of one pack of cigarettes per day for one year. Yearly screening should:  Continue until it has been 15 years since you quit.  Stop if you develop a health problem that would prevent you from having lung cancer treatment. Colorectal Cancer  This type of cancer can be detected and can often be prevented.  Routine colorectal cancer screening usually begins at age 54 and continues through age 63.  If you have  risk factors for colon cancer, your health care provider may recommend that you be screened at an earlier age.  If you have a family history of colorectal cancer, talk with your health care provider about genetic screening.  Your health care provider may also recommend using home test kits to check for hidden blood in your stool.  A small camera at the end of a tube can be used to examine your colon directly (sigmoidoscopy or colonoscopy). This is done to check for the earliest forms of colorectal cancer.  Direct examination of the colon should be repeated every 5-10 years until age 67. However, if early forms of  precancerous polyps or small growths are found or if you have a family history or genetic risk for colorectal cancer, you may need to be screened more often. Skin Cancer  Check your skin from head to toe regularly.  Monitor any moles. Be sure to tell your health care provider:  About any new moles or changes in moles, especially if there is a change in a mole's shape or color.  If you have a mole that is larger than the size of a pencil eraser.  If any of your family members has a history of skin cancer, especially at a young age, talk with your health care provider about genetic screening.  Always use sunscreen. Apply sunscreen liberally and repeatedly throughout the day.  Whenever you are outside, protect yourself by wearing long sleeves, pants, a wide-brimmed hat, and sunglasses. WHAT SHOULD I KNOW ABOUT OSTEOPOROSIS? Osteoporosis is a condition in which bone destruction happens more quickly than new bone creation. After menopause, you may be at an increased risk for osteoporosis. To help prevent osteoporosis or the bone fractures that can happen because of osteoporosis, the following is recommended:  If you are 39-61 years old, get at least 1,000 mg of calcium and at least 600 mg of vitamin D per day.  If you are older than age 16 but younger than age 7, get at least 1,200 mg of calcium and at least 600 mg of vitamin D per day.  If you are older than age 47, get at least 1,200 mg of calcium and at least 800 mg of vitamin D per day. Smoking and excessive alcohol intake increase the risk of osteoporosis. Eat foods that are rich in calcium and vitamin D, and do weight-bearing exercises several times each week as directed by your health care provider. WHAT SHOULD I KNOW ABOUT HOW MENOPAUSE AFFECTS Donna Gonzales? Depression may occur at any age, but it is more common as you become older. Common symptoms of depression include:  Low or sad mood.  Changes in sleep patterns.  Changes  in appetite or eating patterns.  Feeling an overall lack of motivation or enjoyment of activities that you previously enjoyed.  Frequent crying spells. Talk with your health care provider if you think that you are experiencing depression. WHAT SHOULD I KNOW ABOUT IMMUNIZATIONS? It is important that you get and maintain your immunizations. These include:  Tetanus, diphtheria, and pertussis (Tdap) booster vaccine.  Influenza every year before the flu season begins.  Pneumonia vaccine.  Shingles vaccine. Your health care provider may also recommend other immunizations.   This information is not intended to replace advice given to you by your health care provider. Make sure you discuss any questions you have with your health care provider.   Document Released: 02/14/2005 Document Revised: 01/13/2014 Document Reviewed: 08/25/2013 Elsevier Interactive Patient Education 2016 Elsevier  [employer] Insomnia Insomnia is a sleep disorder that makes it difficult to fall asleep or to stay asleep. Insomnia can cause tiredness (fatigue), low energy, difficulty concentrating, mood swings, and poor performance at work or school.  There are three different ways to classify insomnia:  Difficulty falling asleep.  Difficulty staying asleep.  Waking up too early in the morning. Any type of insomnia can be long-term (chronic) or short-term (acute). Both are common. Short-term insomnia usually lasts for three months or less. Chronic insomnia occurs at least three times a week for longer than three months. CAUSES  Insomnia may be caused by another condition, situation, or substance, such as:  Anxiety.  Certain medicines.  Gastroesophageal reflux disease (GERD) or other gastrointestinal conditions.  Asthma or other breathing conditions.  Restless legs syndrome, sleep apnea, or other sleep disorders.  Chronic pain.  Menopause. This may include hot flashes.  Stroke.  Abuse of alcohol, tobacco, or  illegal drugs.  Depression.  Caffeine.   Neurological disorders, such as Alzheimer disease.  An overactive thyroid (hyperthyroidism). The cause of insomnia may not be known. RISK FACTORS Risk factors for insomnia include:  Gender. Women are more commonly affected than men.  Age. Insomnia is more common as you get older.  Stress. This may involve your professional or personal life.  Income. Insomnia is more common in people with lower income.  Lack of exercise.   Irregular work schedule or night shifts.  Traveling between different time zones. SIGNS AND SYMPTOMS If you have insomnia, trouble falling asleep or trouble staying asleep is the main symptom. This may lead to other symptoms, such as:  Feeling fatigued.  Feeling nervous about going to sleep.  Not feeling rested in the morning.  Having trouble concentrating.  Feeling irritable, anxious, or depressed. TREATMENT  Treatment for insomnia depends on the cause. If your insomnia is caused by an underlying condition, treatment will focus on addressing the condition. Treatment may also include:   Medicines to help you sleep.  Counseling or therapy.  Lifestyle adjustments. HOME CARE INSTRUCTIONS   Take medicines only as directed by your health care provider.  Keep regular sleeping and waking hours. Avoid naps.  Keep a sleep diary to help you and your health care provider figure out what could be causing your insomnia. Include:   When you sleep.  When you wake up during the night.  How well you sleep.   How rested you feel the next day.  Any side effects of medicines you are taking.  What you eat and drink.   Make your bedroom a comfortable place where it is easy to fall asleep:  Put up shades or special blackout curtains to block light from outside.  Use a white noise machine to block noise.  Keep the temperature cool.   Exercise regularly as directed by your health care provider. Avoid  exercising right before bedtime.  Use relaxation techniques to manage stress. Ask your health care provider to suggest some techniques that may work well for you. These may include:  Breathing exercises.  Routines to release muscle tension.  Visualizing peaceful scenes.  Cut back on alcohol, caffeinated beverages, and cigarettes, especially close to bedtime. These can disrupt your sleep.  Do not overeat or eat spicy foods right before bedtime. This can lead to digestive discomfort that can make it hard for you to sleep.  Limit screen use before bedtime. This includes:  Watching TV.  Using your smartphone, tablet, and computer.  Stick to a  routine. This can help you fall asleep faster. Try to do a quiet activity, brush your teeth, and go to bed at the same time each night.  Get out of bed if you are still awake after 15 minutes of trying to sleep. Keep the lights down, but try reading or doing a quiet activity. When you feel sleepy, go back to bed.  Make sure that you drive carefully. Avoid driving if you feel very sleepy.  Keep all follow-up appointments as directed by your health care provider. This is important. SEEK MEDICAL CARE IF:   You are tired throughout the day or have trouble in your daily routine due to sleepiness.  You continue to have sleep problems or your sleep problems get worse. SEEK IMMEDIATE MEDICAL CARE IF:   You have serious thoughts about hurting yourself or someone else.   This information is not intended to replace advice given to you by your health care provider. Make sure you discuss any questions you have with your health care provider.   Document Released: 12/21/1999 Document Revised: 09/13/2014 Document Reviewed: 09/23/2013 Elsevier Interactive Patient Education [employer].

## 2015-02-07 NOTE — Progress Notes (Signed)
Donna Gonzales [birth year] [id number]    History:    Presents for annual exam.  Postmenopausal on no HRT with no bleeding. Normal Pap and mammogram history. Vitamin D level 39 2016. Colonoscopy is scheduled this month. 2014 normal DEXA. Has a healthy lifestyle.  Past medical history, past surgical history, family history and social history were all reviewed and documented in the EPIC chart. 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 Son is getting married this year. Mother healthy, father died from heart disease.  ROS:  A ROS was performed and pertinent positives and negatives are included.  Exam:  Filed Vitals:   02/07/15 1421  BP: 115/76    General appearance:  Normal Thyroid:  Symmetrical, normal in size, without palpable masses or nodularity. Respiratory  Auscultation:  Clear without wheezing or rhonchi Cardiovascular  Auscultation:  Regular rate, without rubs, murmurs or gallops  Edema/varicosities:  Not grossly evident Abdominal  Soft,nontender, without masses, guarding or rebound.  Liver/spleen:  No organomegaly noted  Hernia:  None appreciated  Skin  Inspection:  Grossly normal   Breasts: Examined lying and sitting.     Right: Without masses, retractions, discharge or axillary adenopathy.     Left: Without masses, retractions, discharge or axillary adenopathy. Gentitourinary   Inguinal/mons:  Normal without inguinal adenopathy  External genitalia:  Normal  BUS/Urethra/Skene's glands:  Normal  Vagina:  Normal  Cervix:  Normal  Uterus:  normal in size, shape and contour.  Midline and mobile  Adnexa/parametria:     Rt: Without masses or tenderness.   Lt: Without masses or tenderness.  Anus and perineum: Normal  Digital rectal exam: Normal sphincter tone without palpated masses or tenderness  Assessment/Plan:  55 y.o. M WF G2 P2 for annual exam with no complaints.  Postmenopausal on no HRT with no bleeding  Plan: SBE's,  continue annual 3-D screening mammogram history of dense breast. Continue healthy lifestyle of regular exercise, healthy diet. CBC, CMP, lipid panel, UA, Pap normal 2015, new screening guidelines reviewed. History of shingles is unsure if she has had Zostavax will check with primary care and if not will take.  Harrington Challenger [facility], 3:28 PM 02/07/2015

## 2015-02-15 ENCOUNTER — Ambulatory Visit (AMBULATORY_SURGERY_CENTER): Payer: Self-pay

## 2015-02-15 VITALS — Ht 66.0 in | Wt 126.8 lb

## 2015-02-15 DIAGNOSIS — Z1211 Encounter for screening for malignant neoplasm of colon: Secondary | ICD-10-CM

## 2015-02-15 NOTE — Progress Notes (Signed)
No allergies to eggs or soy No past exposure to anesthesia No diet/weight loss meds No home oxygen  Has email and internet; registered emmi

## 2015-03-02 ENCOUNTER — Encounter: Payer: Self-pay | Admitting: Internal Medicine

## 2015-03-02 ENCOUNTER — Telehealth: Payer: Self-pay | Admitting: Internal Medicine

## 2015-03-02 ENCOUNTER — Encounter: Payer: Self-pay | Admitting: *Deleted

## 2015-03-02 NOTE — Telephone Encounter (Signed)
Pt just shy 2 days of needing another PV. New instructions printed and mailed at patients request, Donna Gonzales

## 2015-04-09 ENCOUNTER — Telehealth: Payer: Self-pay | Admitting: Internal Medicine

## 2015-04-13 ENCOUNTER — Ambulatory Visit (AMBULATORY_SURGERY_CENTER): Payer: BLUE CROSS/BLUE SHIELD | Admitting: Internal Medicine

## 2015-04-13 ENCOUNTER — Encounter: Payer: Self-pay | Admitting: Internal Medicine

## 2015-04-13 VITALS — BP 115/61 | HR 43 | Temp 96.9°F | Resp 15 | Ht 66.0 in | Wt 126.0 lb

## 2015-04-13 DIAGNOSIS — Z1211 Encounter for screening for malignant neoplasm of colon: Secondary | ICD-10-CM

## 2015-04-13 MED ORDER — SODIUM CHLORIDE 0.9 % IV SOLN: 500.0000 mL | INTRAVENOUS | Status: DC

## 2015-04-13 NOTE — Op Note (Signed)
[facility] Patient Name: Donna BeechamCynthia Gonzales Procedure Date: 04/13/2015 2:46 PM MRN: [id number] Endoscopist: Iva Booparl E Gessner , MD Age: [age] Date of Birth: Feb 04, 1960 Gender: Female Procedure:                Colonoscopy Indications:              Screening for colorectal malignant neoplasm Medicines:                Propofol per Anesthesia, Monitored Anesthesia Care Procedure:                Pre-Anesthesia Assessment:                           - Prior to the procedure, a History and Physical                            was performed, and patient medications and                            allergies were reviewed. The patient's tolerance of                            previous anesthesia was also reviewed. The risks                            and benefits of the procedure and the sedation                            options and risks were discussed with the patient.                            All questions were answered, and informed consent                            was obtained. Prior Anticoagulants: The patient has                            taken no previous anticoagulant or antiplatelet                            agents. ASA Grade Assessment: I - A normal, healthy                            patient. After reviewing the risks and benefits,                            the patient was deemed in satisfactory condition to                            undergo the procedure.                           After obtaining informed consent, the colonoscope  was passed under direct vision. Throughout the                            procedure, the patient's blood pressure, pulse, and                            oxygen saturations were monitored continuously. The                            Model CF-HQ190L [id number]) scope was introduced                            through the anus and advanced to the the cecum,                            identified by appendiceal orifice and  ileocecal                            valve. The colonoscopy was performed without                            difficulty. The patient tolerated the procedure                            well. The quality of the bowel preparation was                            excellent. The bowel preparation used was Miralax.                            The ileocecal valve, appendiceal orifice, and                            rectum were photographed. Scope In: 2:52:51 PM Scope Out: 3:03:28 PM Scope Withdrawal Time: 0 hours 6 minutes 2 seconds  Total Procedure Duration: 0 hours 10 minutes 37 seconds  Findings:                 The perianal and digital rectal examinations were                            normal.                           The entire examined colon appeared normal on direct                            and retroflexion views. Complications:            No immediate complications. Estimated Blood Loss:     Estimated blood loss: none. Impression:               - The entire examined colon is normal on direct and                            retroflexion views.                           -  No specimens collected. Recommendation:           - Patient has a contact number available for                            emergencies. The signs and symptoms of potential                            delayed complications were discussed with the                            patient. Return to normal activities tomorrow.                            Written discharge instructions were provided to the                            patient.                           - Resume previous diet.                           - Continue present medications.                           - Repeat colonoscopy in 10 years for screening                            purposes. Iva Boop, MD 04/13/2015 3:08:58 PM This report has been signed electronically. CC Letter to:             Davonna Belling. Young

## 2015-04-13 NOTE — Patient Instructions (Addendum)
Your colonoscopy was normal. No polyps or cancer seen!  Next routine colonoscopy in 10 years - 2027  I appreciate the opportunity to care for you. Iva Booparl E. Gessner, MD, FACG   YOU HAD AN ENDOSCOPIC PROCEDURE TODAY AT THE [facility]:   Refer to the procedure report that was given to you for any specific questions about what was found during the examination.  If the procedure report does not answer your questions, please call your gastroenterologist to clarify.  If you requested that your care partner not be given the details of your procedure findings, then the procedure report has been included in a sealed envelope for you to review at your convenience later.  YOU SHOULD EXPECT: Some feelings of bloating in the abdomen. Passage of more gas than usual.  Walking can help get rid of the air that was put into your GI tract during the procedure and reduce the bloating. If you had a lower endoscopy (such as a colonoscopy or flexible sigmoidoscopy) you may notice spotting of blood in your stool or on the toilet paper. If you underwent a bowel prep for your procedure, you may not have a normal bowel movement for a few days.  Please Note:  You might notice some irritation and congestion in your nose or some drainage.  This is from the oxygen used during your procedure.  There is no need for concern and it should clear up in a day or so.  SYMPTOMS TO REPORT IMMEDIATELY:   Following lower endoscopy (colonoscopy or flexible sigmoidoscopy):  Excessive amounts of blood in the stool  Significant tenderness or worsening of abdominal pains  Swelling of the abdomen that is new, acute  Fever of 100F or higher   For urgent or emergent issues, a gastroenterologist can be reached at any hour by calling [phone].   DIET: Your first meal following the procedure should be a small meal and then it is ok to progress to your normal diet. Heavy or fried foods are harder to digest and may  make you feel nauseous or bloated.  Likewise, meals heavy in dairy and vegetables can increase bloating.  Drink plenty of fluids but you should avoid alcoholic beverages for 24 hours.  ACTIVITY:  You should plan to take it easy for the rest of today and you should NOT DRIVE or use heavy machinery until tomorrow (because of the sedation medicines used during the test).    FOLLOW UP: Our staff will call the number listed on your records the next business day following your procedure to check on you and address any questions or concerns that you may have regarding the information given to you following your procedure. If we do not reach you, we will leave a message.  However, if you are feeling well and you are not experiencing any problems, there is no need to return our call.  We will assume that you have returned to your regular daily activities without incident.  Thank-you for choosing us for your healthcare needs today.  If any biopsies were taken you will be contacted by phone or by letter within the next 1-3 weeks.  Please call us at [phone] if you have not heard about the biopsies in 3 weeks.    SIGNATURES/CONFIDENTIALITY: You and/or your care partner have signed paperwork which will be entered into your electronic medical record.  These signatures attest to the fact that that the information above on your After Visit Summary has been  reviewed and is understood.  Full responsibility of the confidentiality of this discharge information lies with you and/or your care-partner. 

## 2015-04-13 NOTE — Progress Notes (Signed)
Report to PACU, RN, vss, BBS= Clear.  

## 2015-04-16 ENCOUNTER — Telehealth: Payer: Self-pay | Admitting: *Deleted

## 2015-04-16 NOTE — Telephone Encounter (Signed)
  Follow up Call-  Call back number 04/13/2015  Post procedure Call Back phone  # [phone]  Permission to leave phone message Yes     Patient questions:  Do you have a fever, pain , or abdominal swelling? No. Pain Score  0 *  Have you tolerated food without any problems? Yes.    Have you been able to return to your normal activities? Yes.    Do you have any questions about your discharge instructions: Diet   No. Medications  No. Follow up visit  No.  Do you have questions or concerns about your Care? No.  Actions: * If pain score is 4 or above: No action needed, pain <4.

## 2015-06-01 ENCOUNTER — Other Ambulatory Visit: Payer: Self-pay

## 2015-06-01 ENCOUNTER — Ambulatory Visit (INDEPENDENT_AMBULATORY_CARE_PROVIDER_SITE_OTHER): Payer: BLUE CROSS/BLUE SHIELD | Admitting: Family Medicine

## 2015-06-01 ENCOUNTER — Encounter: Payer: Self-pay | Admitting: Women's Health

## 2015-06-01 ENCOUNTER — Encounter: Payer: Self-pay | Admitting: Family Medicine

## 2015-06-01 VITALS — BP 98/72 | HR 53 | Ht 66.0 in | Wt 122.0 lb

## 2015-06-01 DIAGNOSIS — M79652 Pain in left thigh: Secondary | ICD-10-CM

## 2015-06-01 DIAGNOSIS — M6580 Other synovitis and tenosynovitis, unspecified site: Secondary | ICD-10-CM

## 2015-06-01 DIAGNOSIS — M76899 Other specified enthesopathies of unspecified lower limb, excluding foot: Secondary | ICD-10-CM | POA: Insufficient documentation

## 2015-06-01 NOTE — Patient Instructions (Signed)
Very nice to meet you  You do have a hamstring tendon injury/tear Thigh compression sleeve with exercise.  Body [email or web address] size small or anywhere else you can find one.  Ice 20 minutes 2 times daily. Usually after activity and before bed. Exercises 3 times a week.  OK to keep working out but no jumping or sprinting Vitamin D 2000 IU daily  See me again in 3 weeks and if not better we will discuss nitro patch We will rescan it as well.

## 2015-06-01 NOTE — Assessment & Plan Note (Addendum)
Patient does have a tendinitis as well as a tear. He seems to be healing appropriately. We discussed icing regimen, compression, home exercises in which activities to do in which ones to avoid. Patient work with Event organiserathletic trainer to learn home exercises in greater detail. We discussed what activities to do an which was to avoid. Patient will avoid an elliptical for now. Did not want any medications for pain. Patient will try conservative therapy and come back and see me again in 2 weeks. Likely relation ultrasound the area again to make sure that healing is appropriate. Patient could also be a candidate for osteopathic manipulation if necessary. We'll also consider nitroglycerin patches that healing is not confirmed on repeat ultrasound.

## 2015-06-01 NOTE — Progress Notes (Signed)
Pre visit review using our clinic review tool, if applicable. No additional management support is needed unless otherwise documented below in the visit note. 

## 2015-06-01 NOTE — Progress Notes (Signed)
Tawana ScaleZach Smith D.O. [facility] [address] [place], [place] [address] Phone: [phone] Subjective:    I'm seeing this patient by the request  of:  Harrington ChallengerYOUNG,NANCY J, NP   CC: Left leg pain  ZHY:[id number]:Subjective Donna BiblesCynthia K Gonzales is a 55 y.o. female coming in with complaint of left leg pain.Patient was an avid runner until multiple months ago. Patient was running at one point and did have a severe pain of the posterior aspect of the left leg. Patient states that her husband did see that she had some swelling in this area. Patient does not remember her had any bruising. Had pain even with walking for quite some time. Patient states now unfortunately whenever she tries to do anything more than regular daily activities she isn't discomfort in the area. Unable to run on a regular basis and unable to sprint. Patient had been trying to use the elliptical and a more regular basis but does seem to exacerbate the situation more. Patient denies radiation down the leg, denies weakness. States that after 3-4 days of avoiding activity it seems to get better very quickly. Patient denies any nighttime pain that sometimes has discomfort when she tries to roll over at night. Rates the severity of discomfort when it occurs is 6 out of 10. Still seems to be intermittent overall. Describes the pain as sharp with tight sensation afterwards.     Past Medical History  Diagnosis Date  . Vaginal yeast infection     history of chronic   . Vaginal delivery     two   Past Surgical History  Procedure Laterality Date  . No past surgeries     Social History   Social History  . Marital Status: Married    Spouse Name: N/A  . Number of Children: N/A  . Years of Education: N/A   Social History Main Topics  . Smoking status: Never Smoker   . Smokeless tobacco: Never Used  . Alcohol Use: 0.6 - 1.2 oz/week    1-2 Glasses of wine per week  . Drug Use: No  . Sexual Activity: Yes    Birth Control/ Protection:  Other-see comments     Comment: vasectomy, intercourse age 55, sexual partners less than 5    Other Topics Concern  . None   Social History Narrative   Allergies  Allergen Reactions  . Other     Red Ants hives, swelling   Family History  Problem Relation Age of Onset  . Heart disease Father     3 heart attacks  . Hypertension Father   . Cancer Father     some type of leukemia  . Hypertension Brother   . Cancer Brother     hodgkins    Past medical history, social, surgical and family history all reviewed in electronic medical record.  No pertanent information unless stated regarding to the chief complaint.   Review of Systems: No headache, visual changes, nausea, vomiting, diarrhea, constipation, dizziness, abdominal pain, skin rash, fevers, chills, night sweats, weight loss, swollen lymph nodes, body aches, joint swelling, muscle aches, chest pain, shortness of breath, mood changes.   Objective Blood pressure 98/72, pulse 53, height 5\' 6"  (1.676 m), weight 122 lb (55.339 kg), last menstrual period 11/27/2010, SpO2 99 %.  General: No apparent distress alert and oriented x3 mood and affect normal, dressed appropriately.  HEENT: Pupils equal, extraocular movements intact  Respiratory: Patient's speak in full sentences and does not appear short of breath  Cardiovascular: No lower extremity edema, non tender, no erythema  Skin: Warm dry intact with no signs of infection or rash on extremities or on axial skeleton.  Abdomen: Soft nontender  Neuro: Cranial nerves II through XII are intact, neurovascularly intact in all extremities with 2+ DTRs and 2+ pulses.  Lymph: No lymphadenopathy of posterior or anterior cervical chain or axillae bilaterally.  Gait normal with good balance and coordination.  MSK:  Non tender with full range of motion and good stability and symmetric strength and tone of shoulders, elbows, wrist, knee and ankles bilaterally.  Hip: Left ROM IR: 25 Deg, ER: 45  Deg, Flexion: 120 Deg, Extension: 100, chest tightness of the hamstring Deg, Abduction: 45 Deg, Adduction: 45 Deg Strength IR: 4/5, ER: 5/5, Flexion: 5/5, Extension: 5/5, Abduction: 5/5, Adduction: 5/5 Pelvic alignment unremarkable to inspection and palpation. Standing hip rotation and gait without trendelenburg sign / unsteadiness. Greater trochanter without tenderness to palpation. No tenderness over piriformis and greater trochanter. No pain with FABER or FADIR. No SI joint tenderness and normal minimal SI movement. Discomfort noted over the left ischial bursitis area. No defect palpated. Good strength but with strength testing in a flexed position patient does have tenderness.  Limited muscular skeletal ultrasound was performed and interpreted by Antoine Primas, M   Limited ultrasound the patient's hamstring shows the patient has intersubstance tearing at the origin of the hamstring tendon. Increasing Doppler flow in the area. Patient has what appears to be a callus formation over the ischial tuberosity. No avulsion fracture noted. Patient's hamstring muscle seems normal. Impression: Hamstring tendinitis with intersubstance tearing   procedure note 97110; 15 minutes spent for Therapeutic exercises as stated in above notes.  This included exercises focusing on stretching, strengthening, with significant focus on eccentric aspects.   Exercises included Pelvic tilt/bracing instruction to focus on control of the pelvic girdle and lower abdominal muscles  Glute strengthening exercises, focusing on proper firing of the glutes without engaging the low back muscles Proper stretching techniques for maximum relief for the hamstrings, hip flexors, low back and some rotation where tolerated  Proper technique shown and discussed handout in great detail with ATC.  All questions were discussed and answered.   Impression and Recommendations:     This case required medical decision making of moderate  complexity.      Note: This dictation was prepared with Dragon dictation along with smaller phrase technology. Any transcriptional errors that result from this process are unintentional.

## 2015-06-25 ENCOUNTER — Ambulatory Visit: Payer: BLUE CROSS/BLUE SHIELD | Admitting: Family Medicine

## 2015-06-26 ENCOUNTER — Ambulatory Visit (INDEPENDENT_AMBULATORY_CARE_PROVIDER_SITE_OTHER): Payer: BLUE CROSS/BLUE SHIELD | Admitting: Family Medicine

## 2015-06-26 ENCOUNTER — Encounter: Payer: Self-pay | Admitting: Family Medicine

## 2015-06-26 VITALS — BP 106/68 | HR 59 | Ht 66.0 in | Wt 121.0 lb

## 2015-06-26 DIAGNOSIS — M6580 Other synovitis and tenosynovitis, unspecified site: Secondary | ICD-10-CM

## 2015-06-26 DIAGNOSIS — M76899 Other specified enthesopathies of unspecified lower limb, excluding foot: Secondary | ICD-10-CM

## 2015-06-26 NOTE — Patient Instructions (Signed)
You are making progress Give yourself a pat on the back.  Ice is your friend Continue the compression with activity  Continue the vitamins Next time shoe shopping look at [employer] and see what you think.  In 2-3 weeks you can start Start a walk-run progression: - Initially start one minute walking than one minute running for 20 mins in the first week,   then 25 mins during the second week, then 30 mins afterwards.  Once you have reached 30 mins: - Run 2 mins, then walk 1 min. -Then run 3 mins, and walk 1 min. -Then run 4 mins, and walk 1 min. -Then run 5 mins, and walk 1 min. -Slowly build up weekly to running 30 mins nonstop.  If painful at any of the steps, back up one step. See me again in 5--6 weeks and we will scan the area again and make sure you are healed.

## 2015-06-26 NOTE — Progress Notes (Signed)
[facility] [address] [place], [place] [address] Phone: [phone] Subjective:    I'm seeing this patient by the request  of:  Harrington Challenger, NP   CC: Left leg pain  NGE:[id number] Donna Gonzales is a 55 y.o. female coming in with complaint of left leg pain.  Patient was found to have intersubstance tearing of the hamstring tendon proximally. Patient electing to try conservative therapy with compression sleeve, home exercises, icing protocol, and vitamin supplementation. Patient states that she is doing 50-60% better. States that she has very minimal pain as long as she ices afterward. No associated back pain. Denies any radiation down the legs. Patient states though that if she tries to jog she does feel little of the discomfort still. Overall though feels like she is making progress.     Past Medical History  Diagnosis Date  . Vaginal yeast infection     history of chronic   . Vaginal delivery     two   Past Surgical History  Procedure Laterality Date  . No past surgeries     Social History   Social History  . Marital Status: Married    Spouse Name: N/A  . Number of Children: N/A  . Years of Education: N/A   Social History Main Topics  . Smoking status: Never Smoker   . Smokeless tobacco: Never Used  . Alcohol Use: 0.6 - 1.2 oz/week    1-2 Glasses of wine per week  . Drug Use: No  . Sexual Activity: Yes    Birth Control/ Protection: Other-see comments     Comment: vasectomy, intercourse age 72, sexual partners less than 5    Other Topics Concern  . None   Social History Narrative   Allergies  Allergen Reactions  . Other     Red Ants hives, swelling   Family History  Problem Relation Age of Onset  . Heart disease Father     3 heart attacks  . Hypertension Father   . Cancer Father     some type of leukemia  . Hypertension Brother   . Cancer Brother     hodgkins    Past medical history, social, surgical and family  history all reviewed in electronic medical record.  No pertanent information unless stated regarding to the chief complaint.   Review of Systems: No headache, visual changes, nausea, vomiting, diarrhea, constipation, dizziness, abdominal pain, skin rash, fevers, chills, night sweats, weight loss, swollen lymph nodes, body aches, joint swelling, muscle aches, chest pain, shortness of breath, mood changes.   Objective Blood pressure 106/68, pulse 59, height  (1.676 m), weight 121 lb (54.885 kg), last menstrual period 11/27/2010, SpO2 98 %.  General: No apparent distress alert and oriented x3 mood and affect normal, dressed appropriately.  HEENT: Pupils equal, extraocular movements intact  Respiratory: Patient's speak in full sentences and does not appear short of breath  Cardiovascular: No lower extremity edema, non tender, no erythema  Skin: Warm dry intact with no signs of infection or rash on extremities or on axial skeleton.  Abdomen: Soft nontender  Neuro: Cranial nerves II through XII are intact, neurovascularly intact in all extremities with 2+ DTRs and 2+ pulses.  Lymph: No lymphadenopathy of posterior or anterior cervical chain or axillae bilaterally.  Gait normal with good balance and coordination.  MSK:  Non tender with full range of motion and good stability and symmetric strength and tone of shoulders, elbows, wrist, knee and  ankles bilaterally.  Hip: Left ROM IR: 25 Deg, ER: 45 Deg, Flexion: 120 Deg, Extension: 100, chest tightness of the hamstring Deg, Abduction: 45 Deg, Adduction: 45 Deg Strength IR: 4/5, ER: 5/5, Flexion: 5/5, Extension: 5/5, Abduction: 5/5, Adduction: 5/5 Pelvic alignment unremarkable to inspection and palpation. Standing hip rotation and gait without trendelenburg sign / unsteadiness. Greater trochanter without tenderness to palpation. No tenderness over piriformis and greater trochanter. No pain with FABER or FADIR. No SI joint tenderness and normal  minimal SI movement. Discomfort noted over the left ischial bursitis area. No defect palpated. Good strength but with strength testing in a flexed position patient does have tenderness still remaining against resistance. Mild improvement.      .   Impression and Recommendations:     This case required medical decision making of moderate complexity.      Note: This dictation was prepared with Dragon dictation along with smaller phrase technology. Any transcriptional errors that result from this process are unintentional.

## 2015-06-26 NOTE — Assessment & Plan Note (Addendum)
Patient seems to be making progress at this time. Still has some discomfort but continues to be active. Patient given a running progression today that I think will be beneficial. Encourage her to continue the over-the-counter medications. We discussed icing as well as continuing the compression. We discussed proper shoes. Patient and will come back and see me again in 3-6 weeks. At that time I would like to ultrasound the area again to make sure that there is good healing. Quick ultrasound today did show some scar tissue formation which make me optimistic patient will do well. Patient fails we will need to consider formal physical therapy, nitroglycerin patches, or PRP injections. Spent  25 minutes with patient face-to-face and had greater than 50% of counseling including as described above in assessment and plan.

## 2015-06-26 NOTE — Progress Notes (Signed)
Pre visit review using our clinic review tool, if applicable. No additional management support is needed unless otherwise documented below in the visit note. 

## 2015-07-30 NOTE — Assessment & Plan Note (Addendum)
Patient continued with conservative therapy. Patient was given another treatment options including PRP, corticosteroid injections, nitroglycerin patches, or formal physical therapy. Patient states should like to continue with conservative therapy. Patient should be pain-free within 4 weeks. If not we'll consider nitroglycerin as well as formal physical therapy.  Spent  25 minutes with patient face-to-face and had greater than 50% of counseling including as described above in assessment and plan.

## 2015-07-30 NOTE — Progress Notes (Signed)
Donna Gonzales [address] [place], [place] [address] Phone: [phone] Subjective:    CC: Left leg pain follow-up  GYK:[id number]  Donna Gonzales is a 55 y.o. female coming in with complaint of left leg pain.  Patient was found to have intersubstance tearing of the hamstring tendon proximally. Patient electing to try conservative therapy with compression sleeve, home exercises, icing protocol, and vitamin supplementation. Patient at last follow-up was doing approximately 50% better. Patient encouraged to continue the same regimen.patient states proximal a 90% better. Some mild discomfort but nothing severe. Has been very active.    Past Medical History:  Diagnosis Date  . Vaginal delivery    two  . Vaginal yeast infection    history of chronic    Past Surgical History:  Procedure Laterality Date  . NO PAST SURGERIES     Social History   Social History  . Marital status: Married    Spouse name: N/A  . Number of children: N/A  . Years of education: N/A   Social History Main Topics  . Smoking status: Never Smoker  . Smokeless tobacco: Never Used  . Alcohol use 0.6 - 1.2 oz/week    1 - 2 Glasses of wine per week  . Drug use: No  . Sexual activity: Yes    Birth control/ protection: Other-see comments     Comment: vasectomy, intercourse age 20, sexual partners less than 5    Other Topics Concern  . None   Social History Narrative  . None   Allergies  Allergen Reactions  . Other     Red Ants hives, swelling   Family History  Problem Relation Age of Onset  . Heart disease Father     3 heart attacks  . Hypertension Father   . Cancer Father     some type of leukemia  . Hypertension Brother   . Cancer Brother     hodgkins    Past medical history, social, surgical and family history all reviewed in electronic medical record.  No pertanent information unless stated regarding to the chief complaint.   Review of Systems: No headache,  visual changes, nausea, vomiting, diarrhea, constipation, dizziness, abdominal pain, skin rash, fevers, chills, night sweats, weight loss, swollen lymph nodes, body aches, joint swelling, muscle aches, chest pain, shortness of breath, mood changes.   Objective  Blood pressure 116/80, pulse (!) 58, weight 121 lb (54.9 kg), last menstrual period 11/27/2010.  General: No apparent distress alert and oriented x3 mood and affect normal, dressed appropriately.  HEENT: Pupils equal, extraocular movements intact  Respiratory: Patient's speak in full sentences and does not appear short of breath  Cardiovascular: No lower extremity edema, non tender, no erythema  Skin: Warm dry intact with no signs of infection or rash on extremities or on axial skeleton.  Abdomen: Soft nontender  Neuro: Cranial nerves II through XII are intact, neurovascularly intact in all extremities with 2+ DTRs and 2+ pulses.  Lymph: No lymphadenopathy of posterior or anterior cervical chain or axillae bilaterally.  Gait normal with good balance and coordination.  MSK:  Non tender with full range of motion and good stability and symmetric strength and tone of shoulders, elbows, wrist, knee and ankles bilaterally.  Hip: Left ROM IR: 25 Deg, ER: 45 Deg, Flexion: 120 Deg, Extension: 100, chest tightness of the hamstring Deg, Abduction: 45 Deg, Adduction: 45 Deg Strength IR: 4/5, ER: 5/5, Flexion: 5/5, Extension: 5/5, Abduction: 5/5, Adduction: 5/5 Pelvic  alignment unremarkable to inspection and palpation. Standing hip rotation and gait without trendelenburg sign / unsteadiness. Greater trochanter without tenderness to palpation. No tenderness over piriformis and greater trochanter. No pain with FABER or FADIR. No SI joint tenderness and normal minimal SI movement. No discomfort over the left ischial bursitis area. No defect noted. Patient has great strength that is symmetric to the contralateral side.  Limited musculoskeletal  ultrasound was performed and interpreted by Antoine Primas, M  Limited ultrasound shows the patient does have continued healing and insertion of the hamstring proximal he. Patient did have what appeared to be a small avulsion fracture with good callous formation at this time. Patient's tenderness is looking better. Still having increasing Doppler flow to the area. This seemed to transverse the tendon itself Impression: Interval healing of hamstring tear.       .   Impression and Recommendations:     This case required medical decision making of moderate complexity.      Note: This dictation was prepared with Dragon dictation along with smaller phrase technology. Any transcriptional errors that result from this process are unintentional.

## 2015-07-31 ENCOUNTER — Encounter: Payer: Self-pay | Admitting: Family Medicine

## 2015-07-31 ENCOUNTER — Other Ambulatory Visit: Payer: Self-pay

## 2015-07-31 ENCOUNTER — Ambulatory Visit (INDEPENDENT_AMBULATORY_CARE_PROVIDER_SITE_OTHER): Payer: BLUE CROSS/BLUE SHIELD | Admitting: Family Medicine

## 2015-07-31 DIAGNOSIS — M76899 Other specified enthesopathies of unspecified lower limb, excluding foot: Secondary | ICD-10-CM

## 2015-07-31 DIAGNOSIS — M6580 Other synovitis and tenosynovitis, unspecified site: Secondary | ICD-10-CM | POA: Diagnosis not present

## 2015-07-31 NOTE — Patient Instructions (Signed)
Good to see you  ICe when you need it You are doing great  No sprinting or jumping for 4 weeks See me if any pain in 1 month or see me when you need me.

## 2015-10-01 NOTE — Progress Notes (Signed)
[facility] [address] [place], [place] [address] Phone: [phone] Subjective:    CC: Left leg pain follow-up  BJY:[id number]  Donna Gonzales is a 55 y.o. female coming in with complaint of left leg pain.  Patient was found to have intersubstance tearing of the hamstring tendon proximally. Was doing 80% better after conservative therapy. Has been another 2 month interval since we seen patient. Patient states Continues to have some mild discomfort. Patient states that still has some discomfort. Seems to be worse when she is sitting for long amount of time. An states that most exercises seems to be doing relatively well though. States that she continues to have pain if she sits for long amount of time. Patient denies any radiation down the leg. Denies any weakness.   Complaining of right elbow pain. This is a new problem. Has had intermittent for multiple years she states though. Patient has had injections before that always seem to be temporary. States that it seems to be on the lateral aspect of the elbow. Worse with repetitive activities. Does not rib or any true injury. Rates the severity of pain is 4 out of 10. No numbness, no radiation, no weakness.    Past Medical History:  Diagnosis Date  . Vaginal delivery    two  . Vaginal yeast infection    history of chronic    Past Surgical History:  Procedure Laterality Date  . NO PAST SURGERIES     Social History   Social History  . Marital status: Married    Spouse name: N/A  . Number of children: N/A  . Years of education: N/A   Social History Main Topics  . Smoking status: Never Smoker  . Smokeless tobacco: Never Used  . Alcohol use 0.6 - 1.2 oz/week    1 - 2 Glasses of wine per week  . Drug use: No  . Sexual activity: Yes    Birth control/ protection: Other-see comments     Comment: vasectomy, intercourse age 47, sexual partners less than 5    Other Topics Concern  . None   Social  History Narrative  . None   Allergies  Allergen Reactions  . Other     Red Ants hives, swelling   Family History  Problem Relation Age of Onset  . Heart disease Father     3 heart attacks  . Hypertension Father   . Cancer Father     some type of leukemia  . Hypertension Brother   . Cancer Brother     hodgkins    Past medical history, social, surgical and family history all reviewed in electronic medical record.  No pertanent information unless stated regarding to the chief complaint.   Review of Systems: No headache, visual changes, nausea, vomiting, diarrhea, constipation, dizziness, abdominal pain, skin rash, fevers, chills, night sweats, weight loss, swollen lymph nodes, body aches, joint swelling, muscle aches, chest pain, shortness of breath, mood changes.   Objective  Blood pressure 106/72, pulse (!) 55, weight 120 lb (54.4 kg), last menstrual period 11/27/2010, SpO2 98 %.  General: No apparent distress alert and oriented x3 mood and affect normal, dressed appropriately.  HEENT: Pupils equal, extraocular movements intact  Respiratory: Patient's speak in full sentences and does not appear short of breath  Cardiovascular: No lower extremity edema, non tender, no erythema  Skin: Warm dry intact with no signs of infection or rash on extremities or on axial skeleton.  Abdomen:  Soft nontender  Neuro: Cranial nerves II through XII are intact, neurovascularly intact in all extremities with 2+ DTRs and 2+ pulses.  Lymph: No lymphadenopathy of posterior or anterior cervical chain or axillae bilaterally.  Gait normal with good balance and coordination.  MSK:  Non tender with full range of motion and good stability and symmetric strength and tone of shoulders,  wrist, knee and ankles bilaterally.  Hip: Left ROM IR: 25 Deg, ER: 45 Deg, Flexion: 120 Deg, Extension: 100, chest tightness of the hamstring Deg, Abduction: 45 Deg, Adduction: 45 Deg Strength IR: 4/5, ER: 5/5, Flexion: 5/5,  Extension: 5/5, Abduction: 5/5, Adduction: 5/5 Pelvic alignment unremarkable to inspection and palpation. Standing hip rotation and gait without trendelenburg sign / unsteadiness. Greater trochanter without tenderness to palpation. No tenderness over piriformis and greater trochanter. No pain with FABER or FADIR. No SI joint tenderness and normal minimal SI movement. No discomfort over the left ischial bursitis area. No defect noted. Patient has great strength that is symmetric to the contralateral side.  Elbow: Right Unremarkable to inspection. Range of motion full pronation, supination, flexion, extension. Strength is full to all of the above directions Stable to varus, valgus stress. Negative moving valgus stress test. Tender over the lateral epicondylar region Ulnar nerve does not sublux. Negative cubital tunnel Tinel's. Collateral elbow unremarkable  Limited musculoskeletal ultrasound was performed and interpreted by Judi SaaZachary M Lajuana Patchell  Limited ultrasound shows the patient does have Increasing Doppler flow at origin of the hamstring tendon. Patient is does not have a significant callus but does have potentially a new avulsion fracture. Very difficult to tell. Patient does have some mild hypoechoic changes but no true intersubstance tearing appreciated.   Procedure note 97110; 15 minutes spent for Therapeutic exercises as stated in above notes.  This included exercises focusing on stretching, strengthening, with significant focus on eccentric aspects.   Lateral Epicondylitis: Elbow anatomy was reviewed, and tendinopathy was explained.  Pt. given a formal rehab program. Series of concentric and eccentric exercises should be done starting with no weight, work up to 1 lb, hammer, etc.  Use counterforce strap if working or using hands.  Formal PT would be beneficial. Emphasized stretching an cross-friction massage Emphasized proper palms up lifting biomechanics to unload ECRB Proper  technique shown and discussed handout in great detail with ATC.  All questions were discussed and answered.      .   Impression and Recommendations:     This case required medical decision making of moderate complexity.      Note: This dictation was prepared with Dragon dictation along with smaller phrase technology. Any transcriptional errors that result from this process are unintentional.

## 2015-10-02 ENCOUNTER — Other Ambulatory Visit: Payer: Self-pay

## 2015-10-02 ENCOUNTER — Encounter: Payer: Self-pay | Admitting: Family Medicine

## 2015-10-02 ENCOUNTER — Ambulatory Visit (INDEPENDENT_AMBULATORY_CARE_PROVIDER_SITE_OTHER): Payer: BLUE CROSS/BLUE SHIELD | Admitting: Family Medicine

## 2015-10-02 VITALS — BP 106/72 | HR 55 | Wt 120.0 lb

## 2015-10-02 DIAGNOSIS — M25521 Pain in right elbow: Secondary | ICD-10-CM

## 2015-10-02 DIAGNOSIS — M6580 Other synovitis and tenosynovitis, unspecified site: Secondary | ICD-10-CM | POA: Diagnosis not present

## 2015-10-02 DIAGNOSIS — M7711 Lateral epicondylitis, right elbow: Secondary | ICD-10-CM

## 2015-10-02 DIAGNOSIS — M76899 Other specified enthesopathies of unspecified lower limb, excluding foot: Secondary | ICD-10-CM

## 2015-10-02 MED ORDER — NITROGLYCERIN 0.2 MG/HR TD PT24: MEDICATED_PATCH | TRANSDERMAL | 1 refills | Status: DC

## 2015-10-02 MED ORDER — VITAMIN D (ERGOCALCIFEROL) 1.25 MG (50000 UNIT) PO CAPS: 50000.0000 [IU] | ORAL_CAPSULE | ORAL | 0 refills | Status: DC

## 2015-10-02 NOTE — Assessment & Plan Note (Signed)
Patient does have more of a tendinitis. We discussed that this is at the origin. Patient will be started on nitroglycerin and warned of potential side effect. We discussed icing regimen and home exercises we discussed which activities doing which was to avoid. Patient will come back and see me again in 3-4 weeks. At that time we'll ultrasound again. Patient could be a candidate for PRP if necessary.

## 2015-10-02 NOTE — Patient Instructions (Signed)
Good to see you.  Ice 20 minutes 2 times daily. Usually after activity and before bed. Exercises 3 times a week.  Wear brace day and night for 1 week then nightly for 2 weeks.  Nitroglycerin Protocol   Apply 1/4 nitroglycerin patch to affected area daily.  Change position of patch within the affected area every 24 hours.  You may experience a headache during the first 1-2 weeks of using the patch, these should subside.  If you experience headaches after beginning nitroglycerin patch treatment, you may take your preferred over the counter pain reliever.  Another side effect of the nitroglycerin patch is skin irritation or rash related to patch adhesive.  Please notify our office if you develop more severe headaches or rash, and stop the patch.  Tendon healing with nitroglycerin patch may require 12 to 24 weeks depending on the extent of injury.  Men should not use if taking Viagra, Cialis, or Levitra.   Do not use if you have migraines or rosacea.  Once weekly vitamin D for next 12 weeks.  See me again in 4 weeks.

## 2015-10-02 NOTE — Assessment & Plan Note (Signed)
Diagnosed in a. Wrist brace given today, work with Event organiserathletic trainer, discussed icing regimen and home exercises. Discussed which activities to avoid. Follow-up again in 4-6 weeks.

## 2015-12-23 ENCOUNTER — Other Ambulatory Visit: Payer: Self-pay | Admitting: Family Medicine

## 2016-02-13 ENCOUNTER — Encounter: Payer: Self-pay | Admitting: Women's Health

## 2016-02-13 ENCOUNTER — Ambulatory Visit (INDEPENDENT_AMBULATORY_CARE_PROVIDER_SITE_OTHER): Payer: BLUE CROSS/BLUE SHIELD | Admitting: Women's Health

## 2016-02-13 VITALS — BP 120/78 | Ht 65.75 in | Wt 121.0 lb

## 2016-02-13 DIAGNOSIS — Z01419 Encounter for gynecological examination (general) (routine) without abnormal findings: Secondary | ICD-10-CM

## 2016-02-13 DIAGNOSIS — Z1151 Encounter for screening for human papillomavirus (HPV): Secondary | ICD-10-CM

## 2016-02-13 DIAGNOSIS — G4700 Insomnia, unspecified: Secondary | ICD-10-CM | POA: Diagnosis not present

## 2016-02-13 DIAGNOSIS — Z13820 Encounter for screening for osteoporosis: Secondary | ICD-10-CM | POA: Diagnosis not present

## 2016-02-13 DIAGNOSIS — Z13220 Encounter for screening for lipoid disorders: Secondary | ICD-10-CM | POA: Diagnosis not present

## 2016-02-13 LAB — CBC WITH DIFFERENTIAL/PLATELET
BASOS ABS: 69 {cells}/uL (ref 0–200)
Basophils Relative: 1 %
EOS ABS: 0 {cells}/uL — AB (ref 15–500)
EOS PCT: 0 %
HCT: 38.8 % (ref 35.0–45.0)
Hemoglobin: 12.9 g/dL (ref 11.7–15.5)
LYMPHS ABS: 1725 {cells}/uL (ref 850–3900)
Lymphocytes Relative: 25 %
MCH: 31.8 pg (ref 27.0–33.0)
MCHC: 33.2 g/dL (ref 32.0–36.0)
MCV: 95.6 fL (ref 80.0–100.0)
MONOS PCT: 7 %
MPV: 9.6 fL (ref 7.5–12.5)
Monocytes Absolute: 483 cells/uL (ref 200–950)
NEUTROS PCT: 67 %
Neutro Abs: 4623 cells/uL (ref 1500–7800)
Platelets: 304 10*3/uL (ref 140–400)
RBC: 4.06 MIL/uL (ref 3.80–5.10)
RDW: 13.3 % (ref 11.0–15.0)
WBC: 6.9 10*3/uL (ref 3.8–10.8)

## 2016-02-13 MED ORDER — ZOLPIDEM TARTRATE 10 MG PO TABS: 10.0000 mg | ORAL_TABLET | Freq: Every evening | ORAL | 3 refills | Status: DC | PRN

## 2016-02-13 NOTE — Addendum Note (Signed)
Addended by: Berna SpareASTILLO, BLANCA A on: 02/13/2016 02:51 PM   Modules accepted: Orders

## 2016-02-13 NOTE — Patient Instructions (Signed)

## 2016-02-13 NOTE — Progress Notes (Signed)
Donna BiblesCynthia K Gonzales 31-Oct-1960 [id number]    History:    Presents for annual exam.  Postmenopausal/no bleeding/no HRT. Normal Pap and mammogram history. 04/2015 negative colonoscopy. This past years had a hamstring tendinitis limiting running ability. 2014 normal dexa.  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 Mother healthy, father deceased from ht disease.  ROS:  A ROS was performed and pertinent positives and negatives are included.  Exam:  Vitals:   02/13/16 1357  BP: 120/78  Weight: 121 lb (54.9 kg)  Height: 5' 5.75" (1.67 m)   Body mass index is 19.68 kg/m.   General appearance:  Normal Thyroid:  Symmetrical, normal in size, without palpable masses or nodularity. Respiratory  Auscultation:  Clear without wheezing or rhonchi Cardiovascular  Auscultation:  Regular rate, without rubs, murmurs or gallops  Edema/varicosities:  Not grossly evident Abdominal  Soft,nontender, without masses, guarding or rebound.  Liver/spleen:  No organomegaly noted  Hernia:  None appreciated  Skin  Inspection:  Grossly normal   Breasts: Examined lying and sitting.     Right: Without masses, retractions, discharge or axillary adenopathy.     Left: Without masses, retractions, discharge or axillary adenopathy. Gentitourinary   Inguinal/mons:  Normal without inguinal adenopathy  External genitalia:  Normal  BUS/Urethra/Skene's glands:  Normal  Vagina:  Normal  Cervix:  Normal  Uterus:  normal in size, shape and contour.  Midline and mobile  Adnexa/parametria:     Rt: Without masses or tenderness.   Lt: Without masses or tenderness.  Anus and perineum: Normal  Digital rectal exam: Normal sphincter tone without palpated masses or tenderness  Assessment/Plan:  56 y.o. MWF G2 P2 for annual exam.     Postmenopausal/no bleeding/no HRT Hamstring  tendinitis-orthopedist managing Insomnia  Plan: Ambien 10 mg by mouth at bedtime when necessary uses sparingly, aware of addictive properties, sleep hygiene reviewed. SBE's, continue annual screening mammogram, calcium rich diet, vitamin D supplement. Continue active lifestyle of regular exercise, yoga. Schedule DEXA, home safety, fall prevention discussed. CBC, lipid panel, CMP, vitamin D, UA, Pap with HR HPV typing, new screening guidelines reviewed.   Harrington ChallengerYOUNG,Iesha Summerhill J WHNP, 2:09 PM 02/13/2016

## 2016-02-14 LAB — COMPREHENSIVE METABOLIC PANEL
ALBUMIN: 4.8 g/dL (ref 3.6–5.1)
ALT: 18 U/L (ref 6–29)
AST: 30 U/L (ref 10–35)
Alkaline Phosphatase: 33 U/L (ref 33–130)
BILIRUBIN TOTAL: 1.5 mg/dL — AB (ref 0.2–1.2)
BUN: 16 mg/dL (ref 7–25)
CHLORIDE: 103 mmol/L (ref 98–110)
CO2: 25 mmol/L (ref 20–31)
CREATININE: 0.94 mg/dL (ref 0.50–1.05)
Calcium: 10 mg/dL (ref 8.6–10.4)
GLUCOSE: 75 mg/dL (ref 65–99)
Potassium: 4.4 mmol/L (ref 3.5–5.3)
SODIUM: 140 mmol/L (ref 135–146)
Total Protein: 7.5 g/dL (ref 6.1–8.1)

## 2016-02-14 LAB — LIPID PANEL
CHOL/HDL RATIO: 1.8 ratio (ref <5.0)
Cholesterol: 224 mg/dL — ABNORMAL HIGH (ref <200)
HDL: 124 mg/dL (ref >50)
LDL CALC: 91 mg/dL (ref <100)
Triglycerides: 44 mg/dL (ref <150)
VLDL: 9 mg/dL (ref <30)

## 2016-02-14 LAB — VITAMIN D 25 HYDROXY (VIT D DEFICIENCY, FRACTURES): VIT D 25 HYDROXY: 78 ng/mL (ref 30–100)

## 2016-02-15 LAB — PAP, TP IMAGING W/ HPV RNA, RFLX HPV TYPE 16,18/45: HPV mRNA, High Risk: NOT DETECTED

## 2016-03-12 ENCOUNTER — Other Ambulatory Visit: Payer: Self-pay | Admitting: Gynecology

## 2016-03-12 DIAGNOSIS — Z13820 Encounter for screening for osteoporosis: Secondary | ICD-10-CM

## 2016-03-31 ENCOUNTER — Ambulatory Visit (INDEPENDENT_AMBULATORY_CARE_PROVIDER_SITE_OTHER): Payer: BLUE CROSS/BLUE SHIELD

## 2016-03-31 DIAGNOSIS — Z13820 Encounter for screening for osteoporosis: Secondary | ICD-10-CM | POA: Diagnosis not present

## 2016-05-21 ENCOUNTER — Encounter: Payer: Self-pay | Admitting: Gynecology

## 2016-06-11 ENCOUNTER — Encounter: Payer: Self-pay | Admitting: Women's Health

## 2016-06-11 DIAGNOSIS — Z1231 Encounter for screening mammogram for malignant neoplasm of breast: Secondary | ICD-10-CM | POA: Diagnosis not present

## 2016-06-25 ENCOUNTER — Other Ambulatory Visit: Payer: Self-pay | Admitting: Women's Health

## 2016-06-25 ENCOUNTER — Telehealth: Payer: Self-pay

## 2016-06-25 DIAGNOSIS — S90852A Superficial foreign body, left foot, initial encounter: Secondary | ICD-10-CM | POA: Diagnosis not present

## 2016-06-25 MED ORDER — FLUCONAZOLE 150 MG PO TABS: 150.0000 mg | ORAL_TABLET | Freq: Once | ORAL | 1 refills | Status: AC

## 2016-06-25 MED ORDER — FLUCONAZOLE 150 MG PO TABS: 150.0000 mg | ORAL_TABLET | Freq: Once | ORAL | 0 refills | Status: DC

## 2016-06-25 NOTE — Telephone Encounter (Signed)
Okay for Diflucan 150 with refill. I hope her foot is better, sports injury?

## 2016-06-25 NOTE — Telephone Encounter (Signed)
Rx sent.  Patient said that she had surgery for removal of foreign object from her foot.

## 2016-06-25 NOTE — Telephone Encounter (Signed)
Recently on antibiotic for her foot. Now with vaginal yeast infection sx and asked if you would prescribe Diflucan for her.

## 2017-02-18 ENCOUNTER — Encounter: Payer: Self-pay | Admitting: Women's Health

## 2017-02-18 ENCOUNTER — Ambulatory Visit (INDEPENDENT_AMBULATORY_CARE_PROVIDER_SITE_OTHER): Payer: BLUE CROSS/BLUE SHIELD | Admitting: Women's Health

## 2017-02-18 VITALS — BP 134/80 | Ht 65.0 in | Wt 125.0 lb

## 2017-02-18 DIAGNOSIS — Z13820 Encounter for screening for osteoporosis: Secondary | ICD-10-CM | POA: Diagnosis not present

## 2017-02-18 DIAGNOSIS — Z01419 Encounter for gynecological examination (general) (routine) without abnormal findings: Secondary | ICD-10-CM | POA: Diagnosis not present

## 2017-02-18 LAB — CBC WITH DIFFERENTIAL/PLATELET
BASOS ABS: 42 {cells}/uL (ref 0–200)
Basophils Relative: 0.7 %
EOS PCT: 1 %
Eosinophils Absolute: 60 cells/uL (ref 15–500)
HCT: 37.1 % (ref 35.0–45.0)
Hemoglobin: 12.5 g/dL (ref 11.7–15.5)
Lymphs Abs: 1986 cells/uL (ref 850–3900)
MCH: 31.5 pg (ref 27.0–33.0)
MCHC: 33.7 g/dL (ref 32.0–36.0)
MCV: 93.5 fL (ref 80.0–100.0)
MONOS PCT: 9.9 %
MPV: 10 fL (ref 7.5–12.5)
NEUTROS ABS: 3318 {cells}/uL (ref 1500–7800)
NEUTROS PCT: 55.3 %
PLATELETS: 314 10*3/uL (ref 140–400)
RBC: 3.97 10*6/uL (ref 3.80–5.10)
RDW: 12.1 % (ref 11.0–15.0)
TOTAL LYMPHOCYTE: 33.1 %
WBC mixed population: 594 cells/uL (ref 200–950)
WBC: 6 10*3/uL (ref 3.8–10.8)

## 2017-02-18 LAB — COMPREHENSIVE METABOLIC PANEL
AG Ratio: 1.6 (calc) (ref 1.0–2.5)
ALT: 20 U/L (ref 6–29)
AST: 34 U/L (ref 10–35)
Albumin: 4.7 g/dL (ref 3.6–5.1)
Alkaline phosphatase (APISO): 37 U/L (ref 33–130)
BUN: 13 mg/dL (ref 7–25)
CO2: 27 mmol/L (ref 20–32)
Calcium: 9.5 mg/dL (ref 8.6–10.4)
Chloride: 102 mmol/L (ref 98–110)
Creat: 0.87 mg/dL (ref 0.50–1.05)
GLUCOSE: 79 mg/dL (ref 65–99)
Globulin: 2.9 g/dL (calc) (ref 1.9–3.7)
Potassium: 4 mmol/L (ref 3.5–5.3)
SODIUM: 138 mmol/L (ref 135–146)
TOTAL PROTEIN: 7.6 g/dL (ref 6.1–8.1)
Total Bilirubin: 1.1 mg/dL (ref 0.2–1.2)

## 2017-02-18 NOTE — Progress Notes (Signed)
Clayton BiblesCynthia K Pacer December 02, 1960 [id number]    History:    Presents for annual exam.  Postmenopausal on no HRT with no bleeding. Normal Pap and mammogram history. History of shingles, had Zostavax. 2017 negative colonoscopy.  Past medical history, past surgical history, family history and social history were all reviewed and documented in the EPIC chart. Homemaker. 2 children both living in [place] doing well. 7 siblings all healthy. Father deceased heart disease. Mother healthy.  ROS:  A ROS was performed and pertinent positives and negatives are included.  Exam:  Vitals:   02/18/17 1401  BP: 134/80  Weight: 125 lb (56.7 kg)  Height: 5\' 5"  (1.651 m)   Body mass index is 20.8 kg/m.   General appearance:  Normal Thyroid:  Symmetrical, normal in size, without palpable masses or nodularity. Respiratory  Auscultation:  Clear without wheezing or rhonchi Cardiovascular  Auscultation:  Regular rate, without rubs, murmurs or gallops  Edema/varicosities:  Not grossly evident Abdominal  Soft,nontender, without masses, guarding or rebound.  Liver/spleen:  No organomegaly noted  Hernia:  None appreciated  Skin  Inspection:  Grossly normal   Breasts: Examined lying and sitting.     Right: Without masses, retractions, discharge or axillary adenopathy.     Left: Without masses, retractions, discharge or axillary adenopathy. Gentitourinary   Inguinal/mons:  Normal without inguinal adenopathy  External genitalia:  Normal  BUS/Urethra/Skene's glands:  Normal  Vagina:  Normal  Cervix:  Normal  Uterus:   normal in size, shape and contour.  Midline and mobile  Adnexa/parametria:     Rt: Without masses or tenderness.   Lt: Without masses or tenderness.  Anus and perineum: Normal  Digital rectal exam: Normal sphincter tone without palpated masses or tenderness  Assessment/Plan:  57 y.o. MWF G2 P2 for annual exam with no complaints.  Postmenopausal/no HRT/no bleeding  Plan: Has had pain  in left pinky finger from an old injury, states it is getting more painful with time. Encouraged to see hand specialist if any treatment available. SBE's, continue annual screening mammogram, calcium rich diet, continue multivitamin daily, vitamin D level normal. Repeat DEXA, normal DEXA 2014. Continue healthy lifestyle of regular exercise and yoga. CBC, CMP, Pap normal with negative HR HPV 2018, new screening guidelines reviewed.    Harrington Challengerancy J Young [facility] CenterWHNP, 2:42 PM 02/18/2017

## 2017-02-18 NOTE — Patient Instructions (Signed)
Health Maintenance for Postmenopausal Women Menopause is a normal process in which your reproductive ability comes to an end. This process happens gradually over a span of months to years, usually between the ages of 22 and 9. Menopause is complete when you have missed 12 consecutive menstrual periods. It is important to talk with your health care provider about some of the most common conditions that affect postmenopausal women, such as heart disease, cancer, and bone loss (osteoporosis). Adopting a healthy lifestyle and getting preventive care can help to promote your health and wellness. Those actions can also lower your chances of developing some of these common conditions. What should I know about menopause? During menopause, you may experience a number of symptoms, such as:  Moderate-to-severe hot flashes.  Night sweats.  Decrease in sex drive.  Mood swings.  Headaches.  Tiredness.  Irritability.  Memory problems.  Insomnia.  Choosing to treat or not to treat menopausal changes is an individual decision that you make with your health care provider. What should I know about hormone replacement therapy and supplements? Hormone therapy products are effective for treating symptoms that are associated with menopause, such as hot flashes and night sweats. Hormone replacement carries certain risks, especially as you become older. If you are thinking about using estrogen or estrogen with progestin treatments, discuss the benefits and risks with your health care provider. What should I know about heart disease and stroke? Heart disease, heart attack, and stroke become more likely as you age. This may be due, in part, to the hormonal changes that your body experiences during menopause. These can affect how your body processes dietary fats, triglycerides, and cholesterol. Heart attack and stroke are both medical emergencies. There are many things that you can do to help prevent heart disease  and stroke:  Have your blood pressure checked at least every 1-2 years. High blood pressure causes heart disease and increases the risk of stroke.  If you are 53-22 years old, ask your health care provider if you should take aspirin to prevent a heart attack or a stroke.  Do not use any tobacco products, including cigarettes, chewing tobacco, or electronic cigarettes. If you need help quitting, ask your health care provider.  It is important to eat a healthy diet and maintain a healthy weight. ? Be sure to include plenty of vegetables, fruits, low-fat dairy products, and lean protein. ? Avoid eating foods that are high in solid fats, added sugars, or salt (sodium).  Get regular exercise. This is one of the most important things that you can do for your health. ? Try to exercise for at least 150 minutes each week. The type of exercise that you do should increase your heart rate and make you sweat. This is known as moderate-intensity exercise. ? Try to do strengthening exercises at least twice each week. Do these in addition to the moderate-intensity exercise.  Know your numbers.Ask your health care provider to check your cholesterol and your blood glucose. Continue to have your blood tested as directed by your health care provider.  What should I know about cancer screening? There are several types of cancer. Take the following steps to reduce your risk and to catch any cancer development as early as possible. Breast Cancer  Practice breast self-awareness. ? This means understanding how your breasts normally appear and feel. ? It also means doing regular breast self-exams. Let your health care provider know about any changes, no matter how small.  If you are 40  or older, have a clinician do a breast exam (clinical breast exam or CBE) every year. Depending on your age, family history, and medical history, it may be recommended that you also have a yearly breast X-ray (mammogram).  If you  have a family history of breast cancer, talk with your health care provider about genetic screening.  If you are at high risk for breast cancer, talk with your health care provider about having an MRI and a mammogram every year.  Breast cancer (BRCA) gene test is recommended for women who have family members with BRCA-related cancers. Results of the assessment will determine the need for genetic counseling and BRCA1 and for BRCA2 testing. BRCA-related cancers include these types: ? Breast. This occurs in males or females. ? Ovarian. ? Tubal. This may also be called fallopian tube cancer. ? Cancer of the abdominal or pelvic lining (peritoneal cancer). ? Prostate. ? Pancreatic.  Cervical, Uterine, and Ovarian Cancer Your health care provider may recommend that you be screened regularly for cancer of the pelvic organs. These include your ovaries, uterus, and vagina. This screening involves a pelvic exam, which includes checking for microscopic changes to the surface of your cervix (Pap test).  For women ages 21-65, health care providers may recommend a pelvic exam and a Pap test every three years. For women ages 79-65, they may recommend the Pap test and pelvic exam, combined with testing for human papilloma virus (HPV), every five years. Some types of HPV increase your risk of cervical cancer. Testing for HPV may also be done on women of any age who have unclear Pap test results.  Other health care providers may not recommend any screening for nonpregnant women who are considered low risk for pelvic cancer and have no symptoms. Ask your health care provider if a screening pelvic exam is right for you.  If you have had past treatment for cervical cancer or a condition that could lead to cancer, you need Pap tests and screening for cancer for at least 20 years after your treatment. If Pap tests have been discontinued for you, your risk factors (such as having a new sexual partner) need to be  reassessed to determine if you should start having screenings again. Some women have medical problems that increase the chance of getting cervical cancer. In these cases, your health care provider may recommend that you have screening and Pap tests more often.  If you have a family history of uterine cancer or ovarian cancer, talk with your health care provider about genetic screening.  If you have vaginal bleeding after reaching menopause, tell your health care provider.  There are currently no reliable tests available to screen for ovarian cancer.  Lung Cancer Lung cancer screening is recommended for adults 69-62 years old who are at high risk for lung cancer because of a history of smoking. A yearly low-dose CT scan of the lungs is recommended if you:  Currently smoke.  Have a history of at least 30 pack-years of smoking and you currently smoke or have quit within the past 15 years. A pack-year is smoking an average of one pack of cigarettes per day for one year.  Yearly screening should:  Continue until it has been 15 years since you quit.  Stop if you develop a health problem that would prevent you from having lung cancer treatment.  Colorectal Cancer  This type of cancer can be detected and can often be prevented.  Routine colorectal cancer screening usually begins at  age 42 and continues through age 45.  If you have risk factors for colon cancer, your health care provider may recommend that you be screened at an earlier age.  If you have a family history of colorectal cancer, talk with your health care provider about genetic screening.  Your health care provider may also recommend using home test kits to check for hidden blood in your stool.  A small camera at the end of a tube can be used to examine your colon directly (sigmoidoscopy or colonoscopy). This is done to check for the earliest forms of colorectal cancer.  Direct examination of the colon should be repeated every  5-10 years until age 71. However, if early forms of precancerous polyps or small growths are found or if you have a family history or genetic risk for colorectal cancer, you may need to be screened more often.  Skin Cancer  Check your skin from head to toe regularly.  Monitor any moles. Be sure to tell your health care provider: ? About any new moles or changes in moles, especially if there is a change in a mole's shape or color. ? If you have a mole that is larger than the size of a pencil eraser.  If any of your family members has a history of skin cancer, especially at a Shelah Heatley age, talk with your health care provider about genetic screening.  Always use sunscreen. Apply sunscreen liberally and repeatedly throughout the day.  Whenever you are outside, protect yourself by wearing long sleeves, pants, a wide-brimmed hat, and sunglasses.  What should I know about osteoporosis? Osteoporosis is a condition in which bone destruction happens more quickly than new bone creation. After menopause, you may be at an increased risk for osteoporosis. To help prevent osteoporosis or the bone fractures that can happen because of osteoporosis, the following is recommended:  If you are 46-71 years old, get at least 1,000 mg of calcium and at least 600 mg of vitamin D per day.  If you are older than age 55 but younger than age 65, get at least 1,200 mg of calcium and at least 600 mg of vitamin D per day.  If you are older than age 54, get at least 1,200 mg of calcium and at least 800 mg of vitamin D per day.  Smoking and excessive alcohol intake increase the risk of osteoporosis. Eat foods that are rich in calcium and vitamin D, and do weight-bearing exercises several times each week as directed by your health care provider. What should I know about how menopause affects my mental health? Depression may occur at any age, but it is more common as you become older. Common symptoms of depression  include:  Low or sad mood.  Changes in sleep patterns.  Changes in appetite or eating patterns.  Feeling an overall lack of motivation or enjoyment of activities that you previously enjoyed.  Frequent crying spells.  Talk with your health care provider if you think that you are experiencing depression. What should I know about immunizations? It is important that you get and maintain your immunizations. These include:  Tetanus, diphtheria, and pertussis (Tdap) booster vaccine.  Influenza every year before the flu season begins.  Pneumonia vaccine.  Shingles vaccine.  Your health care provider may also recommend other immunizations. This information is not intended to replace advice given to you by your health care provider. Make sure you discuss any questions you have with your health care provider. Document Released: 02/14/2005  Document Revised: 07/13/2015 Document Reviewed: 09/26/2014 Elsevier Interactive Patient Education  2018 [employer]  

## 2017-03-31 DIAGNOSIS — M79645 Pain in left finger(s): Secondary | ICD-10-CM | POA: Diagnosis not present

## 2017-06-16 ENCOUNTER — Encounter: Payer: Self-pay | Admitting: Women's Health

## 2017-06-16 DIAGNOSIS — Z1231 Encounter for screening mammogram for malignant neoplasm of breast: Secondary | ICD-10-CM | POA: Diagnosis not present

## 2017-11-16 NOTE — Progress Notes (Signed)
Ms. Carmen received her flu shot at the [facility] today to the LT deltoid by Juanell Fairly, RN Lot [phone] NDC: [phone] Mfg: [id number] Exp: 07/06/18

## 2018-02-24 ENCOUNTER — Ambulatory Visit (INDEPENDENT_AMBULATORY_CARE_PROVIDER_SITE_OTHER): Payer: BLUE CROSS/BLUE SHIELD | Admitting: Women's Health

## 2018-02-24 ENCOUNTER — Encounter: Payer: Self-pay | Admitting: Women's Health

## 2018-02-24 VITALS — BP 120/82 | Ht 65.0 in | Wt 123.0 lb

## 2018-02-24 DIAGNOSIS — Z01419 Encounter for gynecological examination (general) (routine) without abnormal findings: Secondary | ICD-10-CM

## 2018-02-24 NOTE — Patient Instructions (Signed)
Health Maintenance for Postmenopausal Women Menopause is a normal process in which your reproductive ability comes to an end. This process happens gradually over a span of months to years, usually between the ages of 62 and 89. Menopause is complete when you have missed 12 consecutive menstrual periods. It is important to talk with your health care provider about some of the most common conditions that affect postmenopausal women, such as heart disease, cancer, and bone loss (osteoporosis). Adopting a healthy lifestyle and getting preventive care can help to promote your health and wellness. Those actions can also lower your chances of developing some of these common conditions. What should I know about menopause? During menopause, you may experience a number of symptoms, such as:  Moderate-to-severe hot flashes.  Night sweats.  Decrease in sex drive.  Mood swings.  Headaches.  Tiredness.  Irritability.  Memory problems.  Insomnia. Choosing to treat or not to treat menopausal changes is an individual decision that you make with your health care provider. What should I know about hormone replacement therapy and supplements? Hormone therapy products are effective for treating symptoms that are associated with menopause, such as hot flashes and night sweats. Hormone replacement carries certain risks, especially as you become older. If you are thinking about using estrogen or estrogen with progestin treatments, discuss the benefits and risks with your health care provider. What should I know about heart disease and stroke? Heart disease, heart attack, and stroke become more likely as you age. This may be due, in part, to the hormonal changes that your body experiences during menopause. These can affect how your body processes dietary fats, triglycerides, and cholesterol. Heart attack and stroke are both medical emergencies. There are many things that you can do to help prevent heart disease  and stroke:  Have your blood pressure checked at least every 1-2 years. High blood pressure causes heart disease and increases the risk of stroke.  If you are 79-72 years old, ask your health care provider if you should take aspirin to prevent a heart attack or a stroke.  Do not use any tobacco products, including cigarettes, chewing tobacco, or electronic cigarettes. If you need help quitting, ask your health care provider.  It is important to eat a healthy diet and maintain a healthy weight. ? Be sure to include plenty of vegetables, fruits, low-fat dairy products, and lean protein. ? Avoid eating foods that are high in solid fats, added sugars, or salt (sodium).  Get regular exercise. This is one of the most important things that you can do for your health. ? Try to exercise for at least 150 minutes each week. The type of exercise that you do should increase your heart rate and make you sweat. This is known as moderate-intensity exercise. ? Try to do strengthening exercises at least twice each week. Do these in addition to the moderate-intensity exercise.  Know your numbers.Ask your health care provider to check your cholesterol and your blood glucose. Continue to have your blood tested as directed by your health care provider.  What should I know about cancer screening? There are several types of cancer. Take the following steps to reduce your risk and to catch any cancer development as early as possible. Breast Cancer  Practice breast self-awareness. ? This means understanding how your breasts normally appear and feel. ? It also means doing regular breast self-exams. Let your health care provider know about any changes, no matter how small.  If you are 40 or  older, have a clinician do a breast exam (clinical breast exam or CBE) every year. Depending on your age, family history, and medical history, it may be recommended that you also have a yearly breast X-ray (mammogram).  If you  have a family history of breast cancer, talk with your health care provider about genetic screening.  If you are at high risk for breast cancer, talk with your health care provider about having an MRI and a mammogram every year.  Breast cancer (BRCA) gene test is recommended for women who have family members with BRCA-related cancers. Results of the assessment will determine the need for genetic counseling and BRCA1 and for BRCA2 testing. BRCA-related cancers include these types: ? Breast. This occurs in males or females. ? Ovarian. ? Tubal. This may also be called fallopian tube cancer. ? Cancer of the abdominal or pelvic lining (peritoneal cancer). ? Prostate. ? Pancreatic. Cervical, Uterine, and Ovarian Cancer Your health care provider may recommend that you be screened regularly for cancer of the pelvic organs. These include your ovaries, uterus, and vagina. This screening involves a pelvic exam, which includes checking for microscopic changes to the surface of your cervix (Pap test).  For women ages 21-65, health care providers may recommend a pelvic exam and a Pap test every three years. For women ages 39-65, they may recommend the Pap test and pelvic exam, combined with testing for human papilloma virus (HPV), every five years. Some types of HPV increase your risk of cervical cancer. Testing for HPV may also be done on women of any age who have unclear Pap test results.  Other health care providers may not recommend any screening for nonpregnant women who are considered low risk for pelvic cancer and have no symptoms. Ask your health care provider if a screening pelvic exam is right for you.  If you have had past treatment for cervical cancer or a condition that could lead to cancer, you need Pap tests and screening for cancer for at least 20 years after your treatment. If Pap tests have been discontinued for you, your risk factors (such as having a new sexual partner) need to be reassessed  to determine if you should start having screenings again. Some women have medical problems that increase the chance of getting cervical cancer. In these cases, your health care provider may recommend that you have screening and Pap tests more often.  If you have a family history of uterine cancer or ovarian cancer, talk with your health care provider about genetic screening.  If you have vaginal bleeding after reaching menopause, tell your health care provider.  There are currently no reliable tests available to screen for ovarian cancer. Lung Cancer Lung cancer screening is recommended for adults 57-50 years old who are at high risk for lung cancer because of a history of smoking. A yearly low-dose CT scan of the lungs is recommended if you:  Currently smoke.  Have a history of at least 30 pack-years of smoking and you currently smoke or have quit within the past 15 years. A pack-year is smoking an average of one pack of cigarettes per day for one year. Yearly screening should:  Continue until it has been 15 years since you quit.  Stop if you develop a health problem that would prevent you from having lung cancer treatment. Colorectal Cancer  This type of cancer can be detected and can often be prevented.  Routine colorectal cancer screening usually begins at age 12 and continues through  age 75.  If you have risk factors for colon cancer, your health care provider may recommend that you be screened at an earlier age.  If you have a family history of colorectal cancer, talk with your health care provider about genetic screening.  Your health care provider may also recommend using home test kits to check for hidden blood in your stool.  A small camera at the end of a tube can be used to examine your colon directly (sigmoidoscopy or colonoscopy). This is done to check for the earliest forms of colorectal cancer.  Direct examination of the colon should be repeated every 5-10 years until  age 75. However, if early forms of precancerous polyps or small growths are found or if you have a family history or genetic risk for colorectal cancer, you may need to be screened more often. Skin Cancer  Check your skin from head to toe regularly.  Monitor any moles. Be sure to tell your health care provider: ? About any new moles or changes in moles, especially if there is a change in a mole's shape or color. ? If you have a mole that is larger than the size of a pencil eraser.  If any of your family members has a history of skin cancer, especially at a young age, talk with your health care provider about genetic screening.  Always use sunscreen. Apply sunscreen liberally and repeatedly throughout the day.  Whenever you are outside, protect yourself by wearing long sleeves, pants, a wide-brimmed hat, and sunglasses. What should I know about osteoporosis? Osteoporosis is a condition in which bone destruction happens more quickly than new bone creation. After menopause, you may be at an increased risk for osteoporosis. To help prevent osteoporosis or the bone fractures that can happen because of osteoporosis, the following is recommended:  If you are 19-50 years old, get at least 1,000 mg of calcium and at least 600 mg of vitamin D per day.  If you are older than age 50 but younger than age 70, get at least 1,200 mg of calcium and at least 600 mg of vitamin D per day.  If you are older than age 70, get at least 1,200 mg of calcium and at least 800 mg of vitamin D per day. Smoking and excessive alcohol intake increase the risk of osteoporosis. Eat foods that are rich in calcium and vitamin D, and do weight-bearing exercises several times each week as directed by your health care provider. What should I know about how menopause affects my mental health? Depression may occur at any age, but it is more common as you become older. Common symptoms of depression include:  Low or sad  mood.  Changes in sleep patterns.  Changes in appetite or eating patterns.  Feeling an overall lack of motivation or enjoyment of activities that you previously enjoyed.  Frequent crying spells. Talk with your health care provider if you think that you are experiencing depression. What should I know about immunizations? It is important that you get and maintain your immunizations. These include:  Tetanus, diphtheria, and pertussis (Tdap) booster vaccine.  Influenza every year before the flu season begins.  Pneumonia vaccine.  Shingles vaccine. Your health care provider may also recommend other immunizations. This information is not intended to replace advice given to you by your health care provider. Make sure you discuss any questions you have with your health care provider. Document Released: 02/14/2005 Document Revised: 07/13/2015 Document Reviewed: 09/26/2014 Elsevier Interactive Patient Education    2019 [place].

## 2018-02-24 NOTE — Progress Notes (Signed)
Donna Gonzales November 22, 1960 [id number]    History:    Presents for annual exam.  Postmenopausal on no HRT with no bleeding.  Normal Pap and mammogram history.  2017- colonoscopy.  03/2016 normal bone density T score -0.2 at spine.  Has had zostavac.  2019 overall cholesterol slightly elevated but  HDL 124.  Past medical history, past surgical history, family history and social history were all reviewed and documented in the EPIC chart.  Homemaker.  2 children both live in [place].  7 siblings all healthy.  Father deceased heart disease.  Mother healthy.  ROS:  A ROS was performed and pertinent positives and negatives are included.  Exam:  Vitals:   02/24/18 1402  BP: 120/82  Weight: 123 lb (55.8 kg)  Height: 5\' 5"  (1.651 m)   Body mass index is 20.47 kg/m.   General appearance:  Normal Thyroid:  Symmetrical, normal in size, without palpable masses or nodularity. Respiratory  Auscultation:  Clear without wheezing or rhonchi Cardiovascular  Auscultation:  Regular rate, without rubs, murmurs or gallops  Edema/varicosities:  Not grossly evident Abdominal  Soft,nontender, without masses, guarding or rebound.  Liver/spleen:  No organomegaly noted  Hernia:  None appreciated  Skin  Inspection:  Grossly normal   Breasts: Examined lying and sitting.     Right: Without masses, retractions, discharge or axillary adenopathy.     Left: Without masses, retractions, discharge or axillary adenopathy. Gentitourinary   Inguinal/mons:  Normal without inguinal adenopathy  External genitalia:  Normal  BUS/Urethra/Skene's glands:  Normal  Vagina:  Normal  Cervix:  Normal  Uterus:  normal in size, shape and contour.  Midline and mobile  Adnexa/parametria:     Rt: Without masses or tenderness.   Lt: Without masses or tenderness.  Anus and perineum: Normal  Digital rectal exam: Normal sphincter tone without palpated masses or tenderness  Assessment/Plan:  58 y.o. MWF G2 P2 for annual exam  with no complaints.  Postmenopausal/no HRT/no bleeding Right hand - palm questionable enlarged tendon  Plan: Follow-up with orthopedist, Dr. Amanda Pea for hand issue.  SBEs, continue annual 3D screening mammogram, calcium rich foods, vitamin D 1000 daily encouraged.  Continue healthy lifestyle of healthy diet, regular exercise.  Pap normal 2018, new screening guidelines reviewed.. CBC, CMP.    [facility], 2:28 PM 02/24/2018

## 2018-03-03 DIAGNOSIS — R05 Cough: Secondary | ICD-10-CM | POA: Diagnosis not present

## 2018-03-03 DIAGNOSIS — J029 Acute pharyngitis, unspecified: Secondary | ICD-10-CM | POA: Diagnosis not present

## 2018-03-03 DIAGNOSIS — Z20828 Contact with and (suspected) exposure to other viral communicable diseases: Secondary | ICD-10-CM | POA: Diagnosis not present

## 2018-06-22 ENCOUNTER — Encounter: Payer: Self-pay | Admitting: Women's Health

## 2018-06-22 DIAGNOSIS — Z1231 Encounter for screening mammogram for malignant neoplasm of breast: Secondary | ICD-10-CM | POA: Diagnosis not present

## 2018-08-23 NOTE — Progress Notes (Signed)
[facility] [address] [place], [place] [address] Phone: [phone] Subjective:   Fontaine No, am serving as a scribe for Dr. Hulan Saas.   CC: right hip pain   [id number]  Donna Gonzales is a 58 y.o. female coming in with complaint of right hip pain. Pain occurring since February. Insidious onset. Stabbing pain with hip flexion. If she walks longer distances her hip will fatigue. Has tried using IBU and stretching.  Rates severity of pain 7/10  No nighttime pain       Past Medical History:  Diagnosis Date  . Vaginal delivery    two  . Vaginal yeast infection    history of chronic    Past Surgical History:  Procedure Laterality Date  . NO PAST SURGERIES     Social History   Socioeconomic History  . Marital status: Married    Spouse name: Not on file  . Number of children: Not on file  . Years of education: Not on file  . Highest education level: Not on file  Occupational History  . Not on file  Social Needs  . Financial resource strain: Not on file  . Food insecurity    Worry: Not on file    Inability: Not on file  . Transportation needs    Medical: Not on file    Non-medical: Not on file  Tobacco Use  . Smoking status: Never Smoker  . Smokeless tobacco: Never Used  Substance and Sexual Activity  . Alcohol use: Yes    Alcohol/week: 1.0 - 2.0 standard drinks    Types: 1 - 2 Glasses of wine per week  . Drug use: No  . Sexual activity: Yes    Birth control/protection: Other-see comments    Comment: vasectomy, intercourse age 45, sexual partners less than 5   Lifestyle  . Physical activity    Days per week: Not on file    Minutes per session: Not on file  . Stress: Not on file  Relationships  . Social Herbalist on phone: Not on file    Gets together: Not on file    Attends religious service: Not on file    Active member of club or organization: Not on file    Attends meetings of clubs or  organizations: Not on file    Relationship status: Not on file  Other Topics Concern  . Not on file  Social History Narrative  . Not on file   Allergies  Allergen Reactions  . Other     Red Ants hives, swelling   Family History  Problem Relation Age of Onset  . Heart disease Father        3 heart attacks  . Hypertension Father   . Cancer Father        some type of leukemia  . Hypertension Brother   . Cancer Brother        hodgkins         Current Outpatient Medications (Other):  Marland Kitchen  Cholecalciferol (VITAMIN D-3) 1000 units CAPS, Take by mouth. .  diphenhydrAMINE (SOMINEX) 25 MG tablet, Take 25 mg by mouth at bedtime as needed. Reported on 02/07/2015 .  Glucosamine HCl 1000 MG TABS, Take by mouth. 2 daily .  Multiple Vitamin (MULTIVITAMIN) capsule, Take 1 capsule by mouth daily. Reported on 02/07/2015 .  OVER THE COUNTER MEDICATION, capra cleanse .  zolpidem (AMBIEN) 10 MG tablet, Take 1 tablet (10 mg  total) by mouth at bedtime as needed for sleep.    Past medical history, social, surgical and family history all reviewed in electronic medical record.  No pertanent information unless stated regarding to the chief complaint.   Review of Systems:  No headache, visual changes, nausea, vomiting, diarrhea, constipation, dizziness, abdominal pain, skin rash, fevers, chills, night sweats, weight loss, swollen lymph nodes, body aches, joint swelling, muscle aches, chest pain, shortness of breath, mood changes.   Objective  Last menstrual period 11/27/2010. Systems examined below as of    General: No apparent distress alert and oriented x3 mood and affect normal, dressed appropriately.  HEENT: Pupils equal, extraocular movements intact  Respiratory: Patient's speak in full sentences and does not appear short of breath  Cardiovascular: No lower extremity edema, non tender, no erythema  Skin: Warm dry intact with no signs of infection or rash on extremities or on axial skeleton.   Abdomen: Soft nontender  Neuro: Cranial nerves II through XII are intact, neurovascularly intact in all extremities with 2+ DTRs and 2+ pulses.  Lymph: No lymphadenopathy of posterior or anterior cervical chain or axillae bilaterally.  Gait normal with good balance and coordination.  MSK:  Non tender with full range of motion and good stability and symmetric strength and tone of shoulders, elbows, wrist, knee and ankles bilaterally.  Right hip exam shows the patient does have decreased range of motion especially with internal range of motion with only 5 degrees.  External range of motion lacks 5 degrees of external range of motion compared to the contralateral side.  Negative straight leg test.  Patient has mild tenderness to palpation in the groin area.  No pain with resisted abduction of the hip.  Minimal pain with hip flexion and full strength noted.  Neurovascular intact distally.  Limited musculoskeletal ultrasound was performed and interpreted by Judi SaaZachary M Smith  Limited ultrasound of patient's hip shows the patient does have what appears to be mild impingement noted on dynamic view anterior and superior more medially of the hip.  Patient also has what seems to be a small effusion as well as a capsular distention inferior to the femoral head with questionable cortical irregularity. Impression: Possible osteophyte formation of the hip, joint effusion with capsulitis, questionable stress reaction    Impression and Recommendations:     This case required medical decision making of moderate complexity. The above documentation has been reviewed and is accurate and complete Judi SaaZachary M Smith, DO       Note: This dictation was prepared with Dragon dictation along with smaller phrase technology. Any transcriptional errors that result from this process are unintentional.

## 2018-08-24 ENCOUNTER — Other Ambulatory Visit: Payer: Self-pay

## 2018-08-24 ENCOUNTER — Encounter: Payer: Self-pay | Admitting: Family Medicine

## 2018-08-24 ENCOUNTER — Ambulatory Visit: Payer: Self-pay

## 2018-08-24 ENCOUNTER — Ambulatory Visit (INDEPENDENT_AMBULATORY_CARE_PROVIDER_SITE_OTHER): Payer: BC Managed Care – PPO | Admitting: Family Medicine

## 2018-08-24 ENCOUNTER — Ambulatory Visit (INDEPENDENT_AMBULATORY_CARE_PROVIDER_SITE_OTHER)
Admission: RE | Admit: 2018-08-24 | Discharge: 2018-08-24 | Disposition: A | Discharge status: Home / Self Care | Payer: BC Managed Care – PPO | Source: Ambulatory Visit | Attending: Family Medicine | Admitting: Family Medicine

## 2018-08-24 VITALS — BP 104/64 | HR 63 | Ht 65.0 in

## 2018-08-24 DIAGNOSIS — M25551 Pain in right hip: Secondary | ICD-10-CM

## 2018-08-24 DIAGNOSIS — M1611 Unilateral primary osteoarthritis, right hip: Secondary | ICD-10-CM | POA: Diagnosis not present

## 2018-08-24 MED ORDER — MELOXICAM 15 MG PO TABS: 15.0000 mg | ORAL_TABLET | Freq: Every day | ORAL | 0 refills | Status: DC

## 2018-08-24 MED ORDER — VITAMIN D (ERGOCALCIFEROL) 1.25 MG (50000 UNIT) PO CAPS: 50000.0000 [IU] | ORAL_CAPSULE | ORAL | 0 refills | Status: DC

## 2018-08-24 NOTE — Patient Instructions (Signed)
Thigh compression sleeve Xray downstairs Vitamin D once weekly K2- Take 200mg  daily Meloxicam for pain  No high impact exercises See me again in 4-6 weeks

## 2018-08-24 NOTE — Assessment & Plan Note (Signed)
Right hip pain is somewhat concerning for possible stress reaction versus early osteoarthritic changes with osteophyte formation.  X-rays pending.  Was given, discussed compression, nonweightbearing when possible but otherwise low weightbearing exercises.  Discussed vitamin D supplementation, oral anti-inflammatories given, follow-up again in 4 weeks.  Continue to have pain consider possible injections, formal physical therapy or advanced imaging.

## 2018-08-30 ENCOUNTER — Telehealth: Payer: Self-pay | Admitting: *Deleted

## 2018-08-30 MED ORDER — DICLOFENAC SODIUM 75 MG PO TBEC: 75.0000 mg | DELAYED_RELEASE_TABLET | Freq: Two times a day (BID) | ORAL | 0 refills | Status: DC | PRN

## 2018-08-30 NOTE — Telephone Encounter (Signed)
Pt left msg stating that since she started taking the meloxicam she has been having headaches and backaches. She topped the medicine on Saturday and the headaches are still lingering. Pt would like to know if this is normal with meloxicam?

## 2018-08-30 NOTE — Telephone Encounter (Signed)
No its not common.  Can change the anti-inflammatory

## 2018-08-30 NOTE — Telephone Encounter (Signed)
Discussed with pt. Sent in diclofenac to pharmacy.

## 2018-09-18 ENCOUNTER — Encounter: Payer: Self-pay | Admitting: Family Medicine

## 2018-09-29 ENCOUNTER — Encounter: Payer: Self-pay | Admitting: Family Medicine

## 2018-09-29 ENCOUNTER — Ambulatory Visit (INDEPENDENT_AMBULATORY_CARE_PROVIDER_SITE_OTHER): Payer: BC Managed Care – PPO | Admitting: Family Medicine

## 2018-09-29 ENCOUNTER — Other Ambulatory Visit: Payer: Self-pay

## 2018-09-29 DIAGNOSIS — M25551 Pain in right hip: Secondary | ICD-10-CM

## 2018-09-29 DIAGNOSIS — M1611 Unilateral primary osteoarthritis, right hip: Secondary | ICD-10-CM | POA: Diagnosis not present

## 2018-09-29 NOTE — Assessment & Plan Note (Signed)
Patient does have moderate arthritic changes better.  Quick ultrasound showed the patient seems to be patient wants to hold on any type of surgical intervention which I think is good.  I do believe the patient should do well with conservative therapy.  Increase activity slowly follow-up again 6 weeks.

## 2018-09-29 NOTE — Patient Instructions (Signed)
Exercise 3 times a week Ok to walk again in 2 weeks Stick to lower impact stuff  Vitamin D 4,000 IU for 2 weeks then 2,000 after See me again in 6 weeks

## 2018-09-29 NOTE — Progress Notes (Signed)
[facility] [address] [place], [place] [address] Phone: [phone] Subjective:   I Ronelle Nigh am serving as a Neurosurgeon for Dr. Antoine Primas. :    CC: Right hip pain follow-up  [id number]   08/24/2018 Right hip pain is somewhat concerning for possible stress reaction versus early osteoarthritic changes with osteophyte formation.  X-rays pending.  Was given, discussed compression, nonweightbearing when possible but otherwise low weightbearing exercises.  Discussed vitamin D supplementation, oral anti-inflammatories given, follow-up again in 4 weeks.  Continue to have pain consider possible injections, formal physical therapy or advanced imaging.  09/29/2018 Donna Gonzales is a 58 y.o. female coming in with complaint of right hip pain. States she is feeling better but not 100%. More range of motion but less pain.  Patient is doing about 85 to 90% better.  Patient is awake improvement.  Concern over from the pain is IV anti-inflammatory.  Patient has already been swimming and biking but feels no pain when she does those activities.     Past Medical History:  Diagnosis Date  . Vaginal delivery    two  . Vaginal yeast infection    history of chronic    Past Surgical History:  Procedure Laterality Date  . NO PAST SURGERIES     Social History   Socioeconomic History  . Marital status: Married    Spouse name: Not on file  . Number of children: Not on file  . Years of education: Not on file  . Highest education level: Not on file  Occupational History  . Not on file  Social Needs  . Financial resource strain: Not on file  . Food insecurity    Worry: Not on file    Inability: Not on file  . Transportation needs    Medical: Not on file    Non-medical: Not on file  Tobacco Use  . Smoking status: Never Smoker  . Smokeless tobacco: Never Used  Substance and Sexual Activity  . Alcohol use: Yes    Alcohol/week: 1.0 - 2.0 standard drinks    Types: 1 - 2 Glasses of wine per week  . Drug use: No  . Sexual activity: Yes    Birth control/protection: Other-see comments    Comment: vasectomy, intercourse age 41, sexual partners less than 5   Lifestyle  . Physical activity    Days per week: Not on file    Minutes per session: Not on file  . Stress: Not on file  Relationships  . Social Musician on phone: Not on file    Gets together: Not on file    Attends religious service: Not on file    Active member of club or organization: Not on file    Attends meetings of clubs or organizations: Not on file    Relationship status: Not on file  Other Topics Concern  . Not on file  Social History Narrative  . Not on file   Allergies  Allergen Reactions  . Other     Red Ants hives, swelling   Family History  Problem Relation Age of Onset  . Heart disease Father        3 heart attacks  . Hypertension Father   . Cancer Father        some type of leukemia  . Hypertension Brother   . Cancer Brother        hodgkins       Current Outpatient  Medications (Analgesics):  .  diclofenac (VOLTAREN) 75 MG EC tablet, Take 1 tablet (75 mg total) by mouth 2 (two) times daily as needed. .  meloxicam (MOBIC) 15 MG tablet, Take 1 tablet (15 mg total) by mouth daily.   Current Outpatient Medications (Other):  Marland Kitchen  Cholecalciferol (VITAMIN D-3) 1000 units CAPS, Take by mouth. .  diphenhydrAMINE (SOMINEX) 25 MG tablet, Take 25 mg by mouth at bedtime as needed. Reported on 02/07/2015 .  Glucosamine HCl 1000 MG TABS, Take by mouth. 2 daily .  Multiple Vitamin (MULTIVITAMIN) capsule, Take 1 capsule by mouth daily. Reported on 02/07/2015 .  OVER THE COUNTER MEDICATION, capra cleanse .  Vitamin D, Ergocalciferol, (DRISDOL) 1.25 MG (50000 UT) CAPS capsule, Take 1 capsule (50,000 Units total) by mouth every 7 (seven) days. Marland Kitchen  zolpidem (AMBIEN) 10 MG tablet, Take 1 tablet (10 mg total) by mouth at bedtime as needed for sleep.    Past  medical history, social, surgical and family history all reviewed in electronic medical record.  No pertanent information unless stated regarding to the chief complaint.   Review of Systems:  No headache, visual changes, nausea, vomiting, diarrhea, constipation, dizziness, abdominal pain, skin rash, fevers, chills, night sweats, weight loss, swollen lymph nodes, body aches, joint swelling, chest pain, shortness of breath, mood changes.  Positive muscle aches  Objective  Blood pressure 124/84, pulse (!) 58, height 5\' 5"  (1.651 m), weight 125 lb (56.7 kg), last menstrual period 11/27/2010, SpO2 98 %. Systems examined below as of    General: No apparent distress alert and oriented x3 mood and affect normal, dressed appropriately.  HEENT: Pupils equal, extraocular movements intact  Respiratory: Patient's speak in full sentences and does not appear short of breath  Cardiovascular: No lower extremity edema, non tender, no erythema  Skin: Warm dry intact with no signs of infection or rash on extremities or on axial skeleton.  Abdomen: Soft nontender  Neuro: Cranial nerves II through XII are intact, neurovascularly intact in all extremities with 2+ DTRs and 2+ pulses.  Lymph: No lymphadenopathy of posterior or anterior cervical chain or axillae bilaterally.  Gait mild antalgic.  MSK:  Non tender with full range of motion and good stability and symmetric strength and tone of shoulders, elbows, wrist,  knee and ankles bilaterally.  Right hip exam does show the patient does have decreased range of motion especially with internal range of motion of 0.5 degrees.  External range of motion with some near full.  Patient has no weakness with any of the movements.  Fulcrum test minorly positive still on the right    Impression and Recommendations:     This case required medical decision making of moderate complexity. The above documentation has been reviewed and is accurate and complete Lyndal Pulley, DO        Note: This dictation was prepared with Dragon dictation along with smaller phrase technology. Any transcriptional errors that result from this process are unintentional.

## 2018-11-15 NOTE — Progress Notes (Signed)
[facility] [place] [place], [place] [address] Phone: [phone] Subjective:   I Kandace Blitz am serving as a Education administrator for Dr. Hulan Saas.  I'm seeing this patient by the request  of:    CC:   [id number]   09/29/2018 Patient does have moderate arthritic changes better.  Quick ultrasound showed the patient seems to be patient wants to hold on any type of surgical intervention which I think is good.  I do believe the patient should do well with conservative therapy.  Increase activity slowly follow-up again 6 weeks.  11/16/2018 Donna Gonzales is a 58 y.o. female coming in with complaint of right hip pain. Patient states she feels like she is not getting better.  Patient states that it is severe and affecting daily activities.  Patient rates the severity of pain is 9 out of 10.  States that she is unable to walk greater than 200 feet without discomfort and pain.     Past Medical History:  Diagnosis Date  . Vaginal delivery    two  . Vaginal yeast infection    history of chronic    Past Surgical History:  Procedure Laterality Date  . NO PAST SURGERIES     Social History   Socioeconomic History  . Marital status: Married    Spouse name: Not on file  . Number of children: Not on file  . Years of education: Not on file  . Highest education level: Not on file  Occupational History  . Not on file  Social Needs  . Financial resource strain: Not on file  . Food insecurity    Worry: Not on file    Inability: Not on file  . Transportation needs    Medical: Not on file    Non-medical: Not on file  Tobacco Use  . Smoking status: Never Smoker  . Smokeless tobacco: Never Used  Substance and Sexual Activity  . Alcohol use: Yes    Alcohol/week: 1.0 - 2.0 standard drinks    Types: 1 - 2 Glasses of wine per week  . Drug use: No  . Sexual activity: Yes    Birth control/protection: Other-see comments    Comment: vasectomy, intercourse  age 86, sexual partners less than 5   Lifestyle  . Physical activity    Days per week: Not on file    Minutes per session: Not on file  . Stress: Not on file  Relationships  . Social Herbalist on phone: Not on file    Gets together: Not on file    Attends religious service: Not on file    Active member of club or organization: Not on file    Attends meetings of clubs or organizations: Not on file    Relationship status: Not on file  Other Topics Concern  . Not on file  Social History Narrative  . Not on file   Allergies  Allergen Reactions  . Other     Red Ants hives, swelling   Family History  Problem Relation Age of Onset  . Heart disease Father        3 heart attacks  . Hypertension Father   . Cancer Father        some type of leukemia  . Hypertension Brother   . Cancer Brother        hodgkins       Current Outpatient Medications (Analgesics):  .  diclofenac (VOLTAREN) 75  MG EC tablet, Take 1 tablet (75 mg total) by mouth 2 (two) times daily as needed. .  meloxicam (MOBIC) 15 MG tablet, Take 1 tablet (15 mg total) by mouth daily.   Current Outpatient Medications (Other):  Marland Kitchen  Cholecalciferol (VITAMIN D-3) 1000 units CAPS, Take by mouth. .  diphenhydrAMINE (SOMINEX) 25 MG tablet, Take 25 mg by mouth at bedtime as needed. Reported on 02/07/2015 .  Glucosamine HCl 1000 MG TABS, Take by mouth. 2 daily .  Multiple Vitamin (MULTIVITAMIN) capsule, Take 1 capsule by mouth daily. Reported on 02/07/2015 .  OVER THE COUNTER MEDICATION, capra cleanse .  Vitamin D, Ergocalciferol, (DRISDOL) 1.25 MG (50000 UT) CAPS capsule, Take 1 capsule (50,000 Units total) by mouth every 7 (seven) days. Marland Kitchen  zolpidem (AMBIEN) 10 MG tablet, Take 1 tablet (10 mg total) by mouth at bedtime as needed for sleep.    Past medical history, social, surgical and family history all reviewed in electronic medical record.  No pertanent information unless stated regarding to the chief complaint.    Review of Systems:  No headache, visual changes, nausea, vomiting, diarrhea, constipation, dizziness, abdominal pain, skin rash, fevers, chills, night sweats, weight loss, swollen lymph nodes, body aches, joint swelling, muscle aches, chest pain, shortness of breath, mood changes.   Objective  Blood pressure 140/80, pulse (!) 55, height 5\' 5"  (1.651 m), weight 124 lb (56.2 kg), last menstrual period 11/27/2010, SpO2 97 %. ]   General: No apparent distress alert and oriented x3 mood and affect normal, dressed appropriately.  HEENT: Pupils equal, extraocular movements intact  Respiratory: Patient's speak in full sentences and does not appear short of breath  Cardiovascular: No lower extremity edema, non tender, no erythema  Skin: Warm dry intact with no signs of infection or rash on extremities or on axial skeleton.  Abdomen: Soft nontender  Neuro: Cranial nerves II through XII are intact, neurovascularly intact in all extremities with 2+ DTRs and 2+ pulses.  Lymph: No lymphadenopathy of posterior or anterior cervical chain or axillae bilaterally.  Gait normal with good balance and coordination.  MSK:  tender with full range of motion and good stability and symmetric strength and tone of shoulders, elbows, wrist, knee and ankles bilaterally.  Right hip exam has decreased range of motion lacking the last 10 degrees of internal and external range of motion on the right.  Patient has 4-5 strength with hip flexion compared to contralateral side.  Mild positive fulcrum test.  Hamstring tendinitis improved with less pain in the ischial area.   Impression and Recommendations:     This case required medical decision making of moderate complexity. The above documentation has been reviewed and is accurate and complete 11/29/2010, DO       Note: This dictation was prepared with Dragon dictation along with smaller phrase technology. Any transcriptional errors that result from this process are  unintentional.

## 2018-11-16 ENCOUNTER — Ambulatory Visit: Payer: BC Managed Care – PPO | Admitting: Family Medicine

## 2018-11-16 ENCOUNTER — Other Ambulatory Visit: Payer: Self-pay

## 2018-11-16 ENCOUNTER — Encounter: Payer: Self-pay | Admitting: Family Medicine

## 2018-11-16 VITALS — BP 140/80 | HR 55 | Ht 65.0 in | Wt 124.0 lb

## 2018-11-16 DIAGNOSIS — M1611 Unilateral primary osteoarthritis, right hip: Secondary | ICD-10-CM

## 2018-11-16 DIAGNOSIS — M25551 Pain in right hip: Secondary | ICD-10-CM | POA: Diagnosis not present

## 2018-11-16 NOTE — Assessment & Plan Note (Addendum)
Patient does have moderate hip arthritis.  We discussed the possibility of injection.  Patient though would like something that would be more long-term.  Discussed with patient in great length about icing regimen, we discussed continuing home exercises.  Patient wants to avoid a formal physical therapy secondary to the coronavirus but would consider the possibility of surgical intervention if it would be helpful.  I would like to get an MR arthrogram secondary to patient's age and see if there is anything less invasive.  Spent  25 minutes with patient face-to-face and had greater than 50% of counseling including as described above in assessment and plan.

## 2018-11-16 NOTE — Patient Instructions (Addendum)
Will write you or call you about MRI results  After MRI we will talk about injection or surgery

## 2018-12-08 ENCOUNTER — Other Ambulatory Visit: Payer: Self-pay

## 2018-12-08 ENCOUNTER — Ambulatory Visit
Admission: RE | Admit: 2018-12-08 | Discharge: 2018-12-08 | Disposition: A | Discharge status: Home / Self Care | Payer: BC Managed Care – PPO | Source: Ambulatory Visit | Attending: Family Medicine | Admitting: Family Medicine

## 2018-12-08 DIAGNOSIS — M25551 Pain in right hip: Secondary | ICD-10-CM

## 2018-12-08 DIAGNOSIS — M1611 Unilateral primary osteoarthritis, right hip: Secondary | ICD-10-CM | POA: Diagnosis not present

## 2018-12-08 MED ORDER — IOPAMIDOL (ISOVUE-M 200) INJECTION 41%
15.0000 mL | Freq: Once | INTRAMUSCULAR | Status: AC
  Administered 2018-12-08: 15 mL via INTRA_ARTICULAR

## 2018-12-20 ENCOUNTER — Ambulatory Visit: Payer: Self-pay

## 2018-12-20 ENCOUNTER — Ambulatory Visit: Payer: BC Managed Care – PPO | Admitting: Orthopaedic Surgery

## 2018-12-20 ENCOUNTER — Other Ambulatory Visit: Payer: Self-pay

## 2018-12-20 DIAGNOSIS — M25551 Pain in right hip: Secondary | ICD-10-CM

## 2018-12-20 DIAGNOSIS — M1611 Unilateral primary osteoarthritis, right hip: Secondary | ICD-10-CM | POA: Insufficient documentation

## 2018-12-20 NOTE — Progress Notes (Signed)
Subjective: Patient is here for ultrasound-guided intra-articular right hip injection.   Groin pain with DJD.  Objective:  Pain and decreased ROM with IR.  Procedure: Ultrasound-guided right hip injection: After sterile prep with Betadine, injected 8 cc 1% lidocaine without epinephrine and 40 mg methylprednisolone using a 22-gauge spinal needle, passing the needle through the iliofemoral ligament into the femoral head/neck junction.  Injectate seen filling joint capsule.  Good immediate relief.

## 2018-12-20 NOTE — Progress Notes (Signed)
Office Visit Note   Patient: Donna Gonzales           Date of Birth: 10-03-1960           MRN: [id number] Visit Date: 12/20/2018              Requested by: Harrington Challenger, NP [address] [place],  [place] [address] PCP: Harrington Challenger, NP   Assessment & Plan: Visit Diagnoses:  1. Right hip pain   2. Unilateral primary osteoarthritis, right hip     Plan: I went over with Donna Gonzales in detail her x-ray and MRI findings as well as clinical exam findings.  I did show her hip replacement model and talk about hip replacement surgery due to the fact that she does have quite significant arthritis in the right hip.  She has never had a steroid injection at all so I do feel it is appropriate to try an intra-articular steroid injection under ultrasound in her right hip and I will order this with Dr. Prince Rome for today as a consultation.  She agrees with this treatment plan as well.  I did show her hip model explained in detail what hip replacement surgery involves and gave her handout about this.  All question concerns were answered addressed.  I would like to see her back in about 4 weeks to see how she is doing overall.  Follow-Up Instructions: Return in about 4 weeks (around 01/17/2019).   Orders:  Orders Placed This Encounter  Procedures  . [facility] [place] - NO CHARGES   No orders of the defined types were placed in this encounter.     Procedures: No procedures performed   Clinical Data: No additional findings.   Subjective: Chief Complaint  Patient presents with  . Right Hip - Pain  Donna Gonzales is a very pleasant and active 58 year old female who comes in for evaluation treatment of right hip pain.  There is actually an MRI on the canopy system and plain films for me to review.  Is been going on for about a year or more.  The pain is in the groin.  She thought at first she had a groin strain.  It hurts with pivoting activities and at this point is started detrimentally affect her  actives daily living, her mobility and her quality of life.  She denies any specific injury.  She is very active.  She does a lot of riding a bike.  She does report that her younger brother has had a hip replacement.  HPI  Review of Systems She currently denies any headache, chest pain, shortness of breath, fever, chills, vomiting, vomiting  Objective: Vital Signs: LMP 11/27/2010   Physical Exam She is alert and orient x3 and in no acute distress Ortho Exam On examination of both hips her left hip exam is normal with fluid and full range of motion.  Her right hip exam shows pain in the groin with internal and external rotation.  There is definitely stiffness with internal rotation. Specialty Comments:  No specialty comments available.  Imaging: [facility] [place] - NO CHARGES  Result Date: 12/20/2018 Please see Notes tab for imaging impression.  Independent review of x-rays of her pelvis and right hip does show significant joint space narrowing on the right side when comparing the right and left hips.  The MRI also is reviewed and correlates with significant osteoarthritis of the right hip with cartilage changes in the acetabulum and the femoral head as  well as degenerative labral fraying all consistent with moderate severe osteoarthritis.  PMFS History: Patient Active Problem List   Diagnosis Date Noted  . Unilateral primary osteoarthritis, right hip 12/20/2018  . Arthritis of right hip 09/29/2018  . Right hip pain 08/24/2018  . Insomnia 02/13/2016  . Right lateral epicondylitis 10/02/2015  . 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 History of shingles 02/07/2015   Past Medical History:  Diagnosis Date  . Vaginal delivery    two  . Vaginal yeast infection    history of chronic     Family History  Problem Relation Age of Onset  . Heart disease Father        3 heart attacks  . Hypertension Father   . Cancer Father        some type of leukemia  . Hypertension Brother   .  Cancer Brother        hodgkins    Past Surgical History:  Procedure Laterality Date  . NO PAST SURGERIES     Social History   Occupational History  . Not on file  Tobacco Use  . Smoking status: Never Smoker  . Smokeless tobacco: Never Used  Substance and Sexual Activity  . Alcohol use: Yes    Alcohol/week: 1.0 - 2.0 standard drinks    Types: 1 - 2 Glasses of wine per week  . Drug use: No  . Sexual activity: Yes    Birth control/protection: Other-see comments    Comment: vasectomy, intercourse age 76, sexual partners less than 5

## 2019-01-17 ENCOUNTER — Ambulatory Visit (INDEPENDENT_AMBULATORY_CARE_PROVIDER_SITE_OTHER): Payer: BC Managed Care – PPO | Admitting: Orthopaedic Surgery

## 2019-01-17 ENCOUNTER — Encounter: Payer: Self-pay | Admitting: Orthopaedic Surgery

## 2019-01-17 ENCOUNTER — Other Ambulatory Visit: Payer: Self-pay

## 2019-01-17 DIAGNOSIS — M1611 Unilateral primary osteoarthritis, right hip: Secondary | ICD-10-CM | POA: Diagnosis not present

## 2019-01-17 DIAGNOSIS — M25551 Pain in right hip: Secondary | ICD-10-CM

## 2019-01-17 NOTE — Progress Notes (Signed)
Arline Asp is well-known to [place].  She has severe end-stage arthritis involving her right hip.  She is very active and thin 59 year old with no active medical problems.  Her hip pain has been worsening for over a year now.  Last month we did have an intra-articular steroid injection provided under direct ultrasound into her right hip by Dr. Prince Rome.  She says that did not really help her at all.  At this point she is interested in hip replacement surgery.  I have given her handout about what this involves.  I showed her hip model explained in detail the risk and benefits of the surgery.  Given the fact that she is failed conservative treatment I do feel this is medically warranted at this standpoint.  Her x-rays do show severe end-stage arthritis of her right hip with joint space narrowing and sclerotic change as well as osteophytes.  The MRI of her right hip also shows edematous changes in the femoral head and acetabulum.  On examination her right hip has severe pain with internal and external rotation with significant stiffness and rotation.  We had a long thorough discussion about the risk and benefits of total hip arthroplasty surgery.  She has a handout about this.  We talked about her interoperative and postoperative course.  She understands with the COVID-19 pandemic we are limited on her ability to do this and we will see if we can set this up at the private outpatient surgical center in town.  All question concerns were answered and addressed.  We will work on getting this scheduled and give her a call.

## 2019-01-17 NOTE — Progress Notes (Signed)
Telephone call, states is in the process of getting scheduled for total right hip replacement for osteoarthritis.  Had no relief with physical therapy, steroids.

## 2019-01-31 ENCOUNTER — Telehealth: Payer: Self-pay

## 2019-02-02 ENCOUNTER — Other Ambulatory Visit: Payer: Self-pay | Admitting: Physician Assistant

## 2019-02-02 NOTE — Telephone Encounter (Signed)
error 

## 2019-02-07 DIAGNOSIS — D239 Other benign neoplasm of skin, unspecified: Secondary | ICD-10-CM | POA: Diagnosis not present

## 2019-02-07 DIAGNOSIS — L821 Other seborrheic keratosis: Secondary | ICD-10-CM | POA: Diagnosis not present

## 2019-02-07 DIAGNOSIS — L570 Actinic keratosis: Secondary | ICD-10-CM | POA: Diagnosis not present

## 2019-02-07 NOTE — Patient Instructions (Addendum)
DUE TO COVID-19 ONLY ONE VISITOR IS ALLOWED TO COME WITH YOU AND STAY IN THE WAITING ROOM ONLY DURING PRE OP AND PROCEDURE DAY OF SURGERY. THE 1 VISITOR MAY VISIT WITH YOU AFTER SURGERY IN YOUR PRIVATE ROOM DURING VISITING HOURS ONLY!   YOU NEED TO HAVE A COVID 19 TEST ON___2/2/2021____ @__2 :45PM_____, THIS TEST MUST BE DONE BEFORE SURGERY, COME  [address], [place] , .  [facility]) ONCE YOUR COVID TEST IS COMPLETED, PLEASE BEGIN THE QUARANTINE INSTRUCTIONS AS OUTLINED IN YOUR HANDOUT.                Donna Gonzales   Your procedure is scheduled on: 02/11/2019   Report to [facility] Main  Entrance   Report to short stay at 5:30AM     Call this number if you have problems the morning of surgery [phone]    Remember: NO SOLID FOOD AFTER MIDNIGHT THE NIGHT PRIOR TO SURGERY. YOU MAY DRINK CLEAR LIQUIDS.   STOP CLEAR LIQUIDS AT __4:30AM____AND THEN DRINK THE ENSURE PRE-SURGERY DRINK. NOTHING BY MOUTH AFTER THE ENSURE DRINK!    CLEAR LIQUID DIET   Foods Allowed                                                                      Foods Excluded Water  Coffee and tea, regular and decaf                             liquids that you cannot  Plain Jell-O any favor except red or purple                                           see through such as: Fruit ices (not with fruit pulp)                                     milk, soups, orange juice  Iced Popsicles                                    All solid food Carbonated beverages, regular and diet                                    Cranberry, grape and apple juices Sports drinks like Gatorade Lightly seasoned clear broth or consume(fat free) Sugar, honey syrup  Sample Menu Breakfast                                Lunch                                     Supper Cranberry juice  Beef broth                            Chicken broth Jell-O                                     Grape  juice                           Apple juice Coffee or tea                        Jell-O                                      Popsicle                                                Coffee or tea                        Coffee or tea  _____________________________________________________________________  . BRUSH YOUR TEETH MORNING OF SURGERY AND RINSE YOUR MOUTH OUT, NO CHEWING GUM CANDY OR MINTS.     Take these medicines the morning of surgery with A SIP OF WATER: NONE                                 You may not have any metal on your body including hair pins and              piercings  Do not wear jewelry, make-up, lotions, powders or perfumes, deodorant             Do not wear nail polish on your fingernails.  Do not shave  48 hours prior to surgery.               Do not bring valuables to the hospital. [facility].  Contacts, dentures or bridgework may not be worn into surgery.       Patients discharged the day of surgery will not be allowed to drive home. IF YOU ARE HAVING SURGERY AND GOING HOME THE SAME DAY, YOU MUST HAVE AN ADULT TO DRIVE YOU HOME AND BE WITH YOU FOR 24 HOURS. YOU MAY GO HOME BY TAXI OR UBER OR ORTHERWISE, BUT AN ADULT MUST ACCOMPANY YOU HOME AND STAY WITH YOU FOR 24 HOURS.  Name and phone number of your driver:  Special Instructions: N/A              Please read over the following fact sheets you were given: _____________________________________________________________________             [facility] - Preparing for Surgery Before surgery, you can play an important role.  Because skin is not sterile, your skin needs to be as free of germs as possible.  You can reduce the number of germs on your skin by washing with CHG (chlorahexidine gluconate) soap before surgery.  CHG is an antiseptic cleaner which kills germs and bonds with the skin to continue killing germs even after washing. Please DO NOT use if you have  an allergy to CHG or antibacterial soaps.  If your skin becomes reddened/irritated stop using the CHG and inform your nurse when you arrive at Short Stay. Do not shave (including legs and underarms) for at least 48 hours prior to the first CHG shower.  You may shave your face/neck. Please follow these instructions carefully:  1.  Shower with CHG Soap the night before surgery and the  morning of Surgery.  2.  If you choose to wash your hair, wash your hair first as usual with your  normal  shampoo.  3.  After you shampoo, rinse your hair and body thoroughly to remove the  shampoo.                           4.  Use CHG as you would any other liquid soap.  You can apply chg directly  to the skin and wash                       Gently with a scrungie or clean washcloth.  5.  Apply the CHG Soap to your body ONLY FROM THE NECK DOWN.   Do not use on face/ open                           Wound or open sores. Avoid contact with eyes, ears mouth and genitals (private parts).                       Wash face,  Genitals (private parts) with your normal soap.             6.  Wash thoroughly, paying special attention to the area where your surgery  will be performed.  7.  Thoroughly rinse your body with warm water from the neck down.  8.  DO NOT shower/wash with your normal soap after using and rinsing off  the CHG Soap.                9.  Pat yourself dry with a clean towel.            10.  Wear clean pajamas.            11.  Place clean sheets on your bed the night of your first shower and do not  sleep with pets. Day of Surgery : Do not apply any lotions/deodorants the morning of surgery.  Please wear clean clothes to the hospital/surgery center.  FAILURE TO FOLLOW THESE INSTRUCTIONS MAY RESULT IN THE CANCELLATION OF YOUR SURGERY PATIENT SIGNATURE_________________________________  NURSE  SIGNATURE__________________________________  ________________________________________________________________________   Rogelia Mire  An incentive spirometer is a tool that can help keep your lungs clear and active. This tool measures how well you are filling your lungs with each breath. Taking long deep breaths may help reverse or decrease the chance of developing breathing (pulmonary) problems (especially infection) following:  A long period of time when you are unable to move or be active. BEFORE THE PROCEDURE   If the spirometer includes an indicator to show your best effort, your nurse or respiratory therapist will set it to a desired goal.  If possible, sit up straight or lean slightly forward. Try not to slouch.  Hold the incentive spirometer in an upright position. INSTRUCTIONS FOR USE  1. Sit on the edge of your bed if possible, or sit up as far as you can in bed or on a chair. 2. Hold the incentive spirometer in an upright position. 3. Breathe out normally. 4. Place the mouthpiece in your mouth and seal your lips tightly around it. 5. Breathe in slowly and as deeply as possible, raising the piston or the ball toward the top of the column. 6. Hold your breath for 3-5 seconds or for as long as possible. Allow the piston or ball to fall to the bottom of the column. 7. Remove the mouthpiece from your mouth and breathe out normally. 8. Rest for a few seconds and repeat Steps 1 through 7 at least 10 times every 1-2 hours when you are awake. Take your time and take a few normal breaths between deep breaths. 9. The spirometer may include an indicator to show your best effort. Use the indicator as a goal to work toward during each repetition. 10. After each set of 10 deep breaths, practice coughing to be sure your lungs are clear. If you have an incision (the cut made at the time of surgery), support your incision when coughing by placing a pillow or rolled up towels firmly  against it. Once you are able to get out of bed, walk around indoors and cough well. You may stop using the incentive spirometer when instructed by your caregiver.  RISKS AND COMPLICATIONS  Take your time so you do not get dizzy or light-headed.  If you are in pain, you may need to take or ask for pain medication before doing incentive spirometry. It is harder to take a deep breath if you are having pain. AFTER USE  Rest and breathe slowly and easily.  It can be helpful to keep track of a log of your progress. Your caregiver can provide you with a simple table to help with this. If you are using the spirometer at home, follow these instructions: SEEK MEDICAL CARE IF:   You are having difficultly using the spirometer.  You have trouble using the spirometer as often as instructed.  Your pain medication is not giving enough relief while using the spirometer.  You develop fever of 100.5 F (38.1 C) or higher. SEEK IMMEDIATE MEDICAL CARE IF:   You cough up bloody sputum that had not been present before.  You develop fever of 102 F (38.9 C) or greater.  You develop worsening pain at or near the incision site. MAKE SURE YOU:   Understand these instructions.  Will watch your condition.  Will get help right away if you are not doing well or get worse. Document Released: 05/05/2006 Document Revised: 03/17/2011 Document Reviewed: 07/06/2006 [facility] Patient Information 2014 [place], [place].   ________________________________________________________________________

## 2019-02-07 NOTE — Patient Instructions (Signed)
DUE TO COVID-19 ONLY ONE VISITOR IS ALLOWED TO COME WITH YOU AND STAY IN THE WAITING ROOM ONLY DURING PRE OP AND PROCEDURE DAY OF SURGERY. THE 1 VISITOR MAY VISIT WITH YOU AFTER SURGERY IN YOUR PRIVATE ROOM DURING VISITING HOURS ONLY!  YOU NEED TO HAVE A COVID 19 TEST ON___2/2/2021____ @_______ , THIS TEST MUST BE DONE BEFORE SURGERY, COME  [address], [place] , [address].  ([place]) ONCE YOUR COVID TEST IS COMPLETED, PLEASE BEGIN THE QUARANTINE INSTRUCTIONS AS OUTLINED IN YOUR HANDOUT.                Shannette Tabares [place]   Your procedure is scheduled on: 02/11/2019   Report to [facility] Main  Entrance   Report to [place] at 5:30AM     Call this number if you have problems the morning of surgery [phone]    NO SOLID FOOD AFTER MIDNIGHT THE NIGHT PRIOR TO SURGERY. YOU MAY DRINK CLEAR LIQUIDS.   STOP CLEAR LIQUIDS AT ___4:30AM______ AND THEN DRINK THE ENSURE PRE-SURGERY [place]. NOTHING BY MOUTH AFTER THE ENSURE DRINK!    CLEAR LIQUID DIET   Foods Allowed                                                                     Foods Excluded  Coffee and tea, regular and decaf                             liquids that you cannot  Plain Jell-O any favor except red or purple                                           see through such as: Fruit ices (not with fruit pulp)                                     milk, soups, orange juice  Iced Popsicles                                    All solid food Carbonated beverages, regular and diet                                    Cranberry, grape and apple juices Sports drinks like Gatorade Lightly seasoned clear broth or consume(fat free) Sugar, honey syrup  Sample Menu Breakfast                                Lunch                                     Supper Cranberry juice  Beef broth                            Chicken broth Jell-O                                     Grape juice                            Apple juice Coffee or tea                        Jell-O                                      Popsicle                                                Coffee or tea                        Coffee or tea  _____________________________________________________________________   BRUSH YOUR TEETH MORNING OF SURGERY AND RINSE YOUR MOUTH OUT, NO CHEWING GUM CANDY OR MINTS.     Take these medicines the morning of surgery with A SIP OF WATER: NONE                                 You may not have any metal on your body including hair pins and              piercings  Do not wear jewelry, make-up, lotions, powders or perfumes, deodorant             Do not wear nail polish on your fingernails.  Do not shave  48 hours prior to surgery.               Do not bring valuables to the hospital. [facility] IS NOT             RESPONSIBLE   FOR VALUABLES.  Contacts, dentures or bridgework may not be worn into surgery.      Patients discharged the day of surgery will not be allowed to drive home. IF YOU ARE HAVING SURGERY AND GOING HOME THE SAME DAY, YOU MUST HAVE AN ADULT TO DRIVE YOU HOME AND BE WITH YOU FOR 24 HOURS. YOU MAY GO HOME BY TAXI OR UBER OR ORTHERWISE, BUT AN ADULT MUST ACCOMPANY YOU HOME AND STAY WITH YOU FOR 24 HOURS.  Name and phone number of your driver:  Special Instructions: N/A              Please read over the following fact sheets you were given: _____________________________________________________________________             [facility] - Preparing for Surgery Before surgery, you can play an important role.  Because skin is not sterile, your skin needs to be as free of germs as possible.  You can reduce the number of germs on your skin by washing with CHG (chlorahexidine gluconate) soap before surgery.  CHG is an antiseptic cleaner which kills germs and bonds with the skin to continue killing germs even after washing. Please DO NOT use if you have an allergy to CHG or  antibacterial soaps.  If your skin becomes reddened/irritated stop using the CHG and inform your nurse when you arrive at Short Stay. Do not shave (including legs and underarms) for at least 48 hours prior to the first CHG shower.  You may shave your face/neck. Please follow these instructions carefully:  1.  Shower with CHG Soap the night before surgery and the  morning of Surgery.  2.  If you choose to wash your hair, wash your hair first as usual with your  normal  shampoo.  3.  After you shampoo, rinse your hair and body thoroughly to remove the  shampoo.                           4.  Use CHG as you would any other liquid soap.  You can apply chg directly  to the skin and wash                       Gently with a scrungie or clean washcloth.  5.  Apply the CHG Soap to your body ONLY FROM THE NECK DOWN.   Do not use on face/ open                           Wound or open sores. Avoid contact with eyes, ears mouth and genitals (private parts).                       Wash face,  Genitals (private parts) with your normal soap.             6.  Wash thoroughly, paying special attention to the area where your surgery  will be performed.  7.  Thoroughly rinse your body with warm water from the neck down.  8.  DO NOT shower/wash with your normal soap after using and rinsing off  the CHG Soap.                9.  Pat yourself dry with a clean towel.            10.  Wear clean pajamas.            11.  Place clean sheets on your bed the night of your first shower and do not  sleep with pets. Day of Surgery : Do not apply any lotions/deodorants the morning of surgery.  Please wear clean clothes to the hospital/surgery center.  FAILURE TO FOLLOW THESE INSTRUCTIONS MAY RESULT IN THE CANCELLATION OF YOUR SURGERY PATIENT SIGNATURE_________________________________  NURSE SIGNATURE__________________________________  ________________________________________________________________________   Rogelia Mire  An incentive spirometer is a tool that can help keep your lungs clear and active. This tool measures how well you are filling your lungs with each breath. Taking long deep breaths may help reverse or decrease the chance of developing breathing (pulmonary) problems (especially infection) following:  A long period of time when you are unable to move or be active. BEFORE THE PROCEDURE   If the spirometer includes an indicator to show your best effort, your nurse or respiratory therapist will set it to a desired goal.  If possible, sit up straight or lean slightly forward. Try not to slouch.  Hold the incentive spirometer in an upright position. INSTRUCTIONS FOR USE  1. Sit on the edge of your bed if possible, or sit up as far as you can in bed or on a chair. 2. Hold the incentive spirometer in an upright position. 3. Breathe out normally. 4. Place the mouthpiece in your mouth and seal your lips tightly around it. 5. Breathe in slowly and as deeply as possible, raising the piston or the ball toward the top of the column. 6. Hold your breath for 3-5 seconds or for as long as possible. Allow the piston or ball to fall to the bottom of the column. 7. Remove the mouthpiece from your mouth and breathe out normally. 8. Rest for a few seconds and repeat Steps 1 through 7 at least 10 times every 1-2 hours when you are awake. Take your time and take a few normal breaths between deep breaths. 9. The spirometer may include an indicator to show your best effort. Use the indicator as a goal to work toward during each repetition. 10. After each set of 10 deep breaths, practice coughing to be sure your lungs are clear. If you have an incision (the cut made at the time of surgery), support your incision when coughing by placing a pillow or rolled up towels firmly against it. Once you are able to get out of bed, walk around indoors and cough well. You may stop using the incentive spirometer when  instructed by your caregiver.  RISKS AND COMPLICATIONS  Take your time so you do not get dizzy or light-headed.  If you are in pain, you may need to take or ask for pain medication before doing incentive spirometry. It is harder to take a deep breath if you are having pain. AFTER USE  Rest and breathe slowly and easily.  It can be helpful to keep track of a log of your progress. Your caregiver can provide you with a simple table to help with this. If you are using the spirometer at home, follow these instructions: [place] IF:   You are having difficultly using the spirometer.  You have trouble using the spirometer as often as instructed.  Your pain medication is not giving enough relief while using the spirometer.  You develop fever of 100.5 F (38.1 C) or higher. SEEK IMMEDIATE MEDICAL CARE IF:   You cough up bloody sputum that had not been present before.  You develop fever of 102 F (38.9 C) or greater.  You develop worsening pain at or near the incision site. MAKE SURE YOU:   Understand these instructions.  Will watch your condition.  Will get help right away if you are not doing well or get worse. Document Released: 05/05/2006 Document Revised: 03/17/2011 Document Reviewed: 07/06/2006 [facility] Patient Information 2014 [place], [place].   ________________________________________________________________________

## 2019-02-08 ENCOUNTER — Other Ambulatory Visit (HOSPITAL_COMMUNITY)
Admission: RE | Admit: 2019-02-08 | Discharge: 2019-02-08 | Disposition: A | Discharge status: Home / Self Care | Payer: BC Managed Care – PPO | Source: Ambulatory Visit | Attending: Orthopaedic Surgery | Admitting: Orthopaedic Surgery

## 2019-02-08 ENCOUNTER — Encounter (HOSPITAL_COMMUNITY)
Admission: RE | Admit: 2019-02-08 | Discharge: 2019-02-08 | Disposition: A | Discharge status: Home / Self Care | Payer: BC Managed Care – PPO | Source: Ambulatory Visit | Attending: Orthopaedic Surgery | Admitting: Orthopaedic Surgery

## 2019-02-08 ENCOUNTER — Encounter (HOSPITAL_COMMUNITY): Payer: Self-pay

## 2019-02-08 ENCOUNTER — Other Ambulatory Visit: Payer: Self-pay

## 2019-02-08 DIAGNOSIS — Z20822 Contact with and (suspected) exposure to covid-19: Secondary | ICD-10-CM | POA: Insufficient documentation

## 2019-02-08 DIAGNOSIS — Z01812 Encounter for preprocedural laboratory examination: Secondary | ICD-10-CM | POA: Diagnosis not present

## 2019-02-08 HISTORY — DX: Palmar fascial fibromatosis (dupuytren): M72.0

## 2019-02-08 HISTORY — DX: Unspecified osteoarthritis, unspecified site: M19.90

## 2019-02-08 LAB — CBC
HCT: 38.6 % (ref 36.0–46.0)
Hemoglobin: 12.6 g/dL (ref 12.0–15.0)
MCH: 32.7 pg (ref 26.0–34.0)
MCHC: 32.6 g/dL (ref 30.0–36.0)
MCV: 100.3 fL — ABNORMAL HIGH (ref 80.0–100.0)
Platelets: 276 10*3/uL (ref 150–400)
RBC: 3.85 MIL/uL — ABNORMAL LOW (ref 3.87–5.11)
RDW: 12.6 % (ref 11.5–15.5)
WBC: 6.3 10*3/uL (ref 4.0–10.5)
nRBC: 0 % (ref 0.0–0.2)

## 2019-02-08 LAB — SARS CORONAVIRUS 2 (TAT 6-24 HRS): SARS Coronavirus 2: NEGATIVE

## 2019-02-08 LAB — SURGICAL PCR SCREEN
MRSA, PCR: NEGATIVE
Staphylococcus aureus: POSITIVE — AB

## 2019-02-08 NOTE — Progress Notes (Signed)
PCP - Harrington Challenger, NP Cardiologist -   Chest x-ray -  EKG - ordered for 02/08/2019 Stress Test -  ECHO -  Cardiac Cath -   Sleep Study -  CPAP -   Fasting Blood Sugar -  Checks Blood Sugar _____ times a day  Blood Thinner Instructions: Aspirin Instructions: Last Dose:  Anesthesia review:  None   Patient denies shortness of breath, fever, cough and chest pain at PAT appointment   Patient verbalized understanding of instructions that were given to them at the PAT appointment. Patient was also instructed that they will need to review over the PAT instructions again at home before surgery.

## 2019-02-09 ENCOUNTER — Telehealth: Payer: Self-pay | Admitting: *Deleted

## 2019-02-09 NOTE — Telephone Encounter (Signed)
RNCM called patient to discuss her upcoming Right Total hip arthroplasty with Dr. Magnus Ivan scheduled for Friday, 02/11/19. It is anticipated that patient will be a same day discharge after her surgery depending on her status and MD requested CM assist with any needs. Patient has spousal support and will have assistance at home after surgery. She will need a FWW and doesn't feel she will need a 3in1/BSC. Referral to [employer] and DME to be delivered to hospital on day of surgery. Anticipate HHPT will be needed. Choice provided and referral made to [facility]. F/U appointment scheduled for 02/24/19 at 2:00 pm. All pre- and post-op instructions reviewed and patient allowed to ask questions. Will continue to assist with CM needs as they arise. Ralph Dowdy, RN,BSN,CCM [phone].

## 2019-02-09 NOTE — Progress Notes (Signed)
PCR results routed to Dr. Magnus Ivan for review

## 2019-02-10 ENCOUNTER — Other Ambulatory Visit: Payer: Self-pay | Admitting: Orthopaedic Surgery

## 2019-02-10 MED ORDER — ASPIRIN 81 MG PO CHEW: 81.0000 mg | CHEWABLE_TABLET | Freq: Two times a day (BID) | ORAL | 0 refills | Status: DC

## 2019-02-10 MED ORDER — OXYCODONE HCL 5 MG PO TABS: 5.0000 mg | ORAL_TABLET | Freq: Four times a day (QID) | ORAL | 0 refills | Status: DC | PRN

## 2019-02-10 MED ORDER — METHOCARBAMOL 500 MG PO TABS: 500.0000 mg | ORAL_TABLET | Freq: Four times a day (QID) | ORAL | 0 refills | Status: DC

## 2019-02-10 NOTE — Anesthesia Preprocedure Evaluation (Addendum)
Anesthesia Evaluation  Patient identified by MRN, date of birth, ID band Patient awake    Reviewed: Allergy & Precautions, NPO status , Patient's Chart, lab work & pertinent test results  Airway Mallampati: II  TM Distance: >3 FB Neck ROM: Full    Dental no notable dental hx. (+) Teeth Intact, Dental Advisory Given   Pulmonary neg pulmonary ROS,    Pulmonary exam normal breath sounds clear to auscultation       Cardiovascular negative cardio ROS Normal cardiovascular exam Rhythm:Regular Rate:Normal     Neuro/Psych negative neurological ROS  negative psych ROS   GI/Hepatic negative GI ROS, (+)     substance abuse  alcohol use,   Endo/Other  negative endocrine ROS  Renal/GU negative Renal ROS  negative genitourinary   Musculoskeletal  (+) Arthritis , Osteoarthritis,    Abdominal Normal abdominal exam  (+)   Peds negative pediatric ROS (+)  Hematology negative hematology ROS (+) plt 276   Anesthesia Other Findings   Reproductive/Obstetrics negative OB ROS                            Anesthesia Physical Anesthesia Plan  ASA: II  Anesthesia Plan: Spinal and MAC   Post-op Pain Management:    Induction:   PONV Risk Score and Plan: 2 and Propofol infusion, TIVA and Treatment may vary due to age or medical condition  Airway Management Planned: Natural Airway and Nasal Cannula  Additional Equipment: None  Intra-op Plan:   Post-operative Plan:   Informed Consent: I have reviewed the patients History and Physical, chart, labs and discussed the procedure including the risks, benefits and alternatives for the proposed anesthesia with the patient or authorized representative who has indicated his/her understanding and acceptance.       Plan Discussed with: CRNA  Anesthesia Plan Comments:         Anesthesia Quick Evaluation

## 2019-02-11 ENCOUNTER — Ambulatory Visit (HOSPITAL_COMMUNITY): Payer: BC Managed Care – PPO

## 2019-02-11 ENCOUNTER — Other Ambulatory Visit: Payer: Self-pay | Admitting: Orthopaedic Surgery

## 2019-02-11 ENCOUNTER — Ambulatory Visit (HOSPITAL_COMMUNITY)
Admission: RE | Admit: 2019-02-11 | Discharge: 2019-02-11 | Disposition: A | Discharge status: Home / Self Care | Payer: BC Managed Care – PPO | Attending: Orthopaedic Surgery | Admitting: Orthopaedic Surgery

## 2019-02-11 ENCOUNTER — Ambulatory Visit (HOSPITAL_COMMUNITY): Payer: BC Managed Care – PPO | Admitting: Anesthesiology

## 2019-02-11 ENCOUNTER — Encounter (HOSPITAL_COMMUNITY): Payer: Self-pay | Admitting: Orthopaedic Surgery

## 2019-02-11 ENCOUNTER — Ambulatory Visit (HOSPITAL_COMMUNITY): Payer: BC Managed Care – PPO | Admitting: Physician Assistant

## 2019-02-11 ENCOUNTER — Encounter (HOSPITAL_COMMUNITY)
Admission: RE | Disposition: A | Discharge status: Home / Self Care | Payer: Self-pay | Source: Home / Self Care | Attending: Orthopaedic Surgery

## 2019-02-11 ENCOUNTER — Other Ambulatory Visit: Payer: Self-pay

## 2019-02-11 DIAGNOSIS — G4700 Insomnia, unspecified: Secondary | ICD-10-CM | POA: Insufficient documentation

## 2019-02-11 DIAGNOSIS — Z79899 Other long term (current) drug therapy: Secondary | ICD-10-CM | POA: Insufficient documentation

## 2019-02-11 DIAGNOSIS — Z96641 Presence of right artificial hip joint: Secondary | ICD-10-CM | POA: Diagnosis not present

## 2019-02-11 DIAGNOSIS — Z419 Encounter for procedure for purposes other than remedying health state, unspecified: Secondary | ICD-10-CM

## 2019-02-11 DIAGNOSIS — R262 Difficulty in walking, not elsewhere classified: Secondary | ICD-10-CM | POA: Diagnosis not present

## 2019-02-11 DIAGNOSIS — Z471 Aftercare following joint replacement surgery: Secondary | ICD-10-CM | POA: Diagnosis not present

## 2019-02-11 DIAGNOSIS — Z7982 Long term (current) use of aspirin: Secondary | ICD-10-CM | POA: Diagnosis not present

## 2019-02-11 DIAGNOSIS — M1611 Unilateral primary osteoarthritis, right hip: Secondary | ICD-10-CM | POA: Diagnosis not present

## 2019-02-11 HISTORY — PX: TOTAL HIP ARTHROPLASTY: SHX124

## 2019-02-11 SURGERY — ARTHROPLASTY, HIP, TOTAL, ANTERIOR APPROACH
Anesthesia: Monitor Anesthesia Care | Site: Hip | Laterality: Right

## 2019-02-11 MED ORDER — EPHEDRINE SULFATE-NACL 50-0.9 MG/10ML-% IV SOSY
PREFILLED_SYRINGE | INTRAVENOUS | Status: DC | PRN
  Administered 2019-02-11 (×2): 10 mg via INTRAVENOUS

## 2019-02-11 MED ORDER — DEXAMETHASONE SODIUM PHOSPHATE 10 MG/ML IJ SOLN
INTRAMUSCULAR | Status: DC | PRN
  Administered 2019-02-11: 10 mg via INTRAVENOUS

## 2019-02-11 MED ORDER — MIDAZOLAM HCL 2 MG/2ML IJ SOLN
INTRAMUSCULAR | Status: AC
  Filled 2019-02-11: qty 2

## 2019-02-11 MED ORDER — PROPOFOL 500 MG/50ML IV EMUL
INTRAVENOUS | Status: DC | PRN
  Administered 2019-02-11: 15 mg via INTRAVENOUS

## 2019-02-11 MED ORDER — CEFAZOLIN SODIUM-DEXTROSE 2-4 GM/100ML-% IV SOLN
2.0000 g | INTRAVENOUS | Status: AC
  Administered 2019-02-11: 2 g via INTRAVENOUS

## 2019-02-11 MED ORDER — PHENYLEPHRINE HCL (PRESSORS) 10 MG/ML IV SOLN
INTRAVENOUS | Status: AC
  Filled 2019-02-11: qty 1

## 2019-02-11 MED ORDER — OXYCODONE HCL 5 MG PO TABS
ORAL_TABLET | ORAL | Status: AC
  Filled 2019-02-11: qty 2

## 2019-02-11 MED ORDER — CEFAZOLIN SODIUM-DEXTROSE 1-4 GM/50ML-% IV SOLN
1.0000 g | Freq: Four times a day (QID) | INTRAVENOUS | Status: AC
  Administered 2019-02-11: 14:00:00 1 g via INTRAVENOUS
  Filled 2019-02-11: qty 50

## 2019-02-11 MED ORDER — CHLORHEXIDINE GLUCONATE 4 % EX LIQD: 60.0000 mL | Freq: Once | CUTANEOUS | Status: DC

## 2019-02-11 MED ORDER — HYDROMORPHONE HCL 1 MG/ML IJ SOLN
INTRAMUSCULAR | Status: AC
  Filled 2019-02-11: qty 1

## 2019-02-11 MED ORDER — ONDANSETRON 4 MG PO TBDP: 4.0000 mg | ORAL_TABLET | Freq: Three times a day (TID) | ORAL | 0 refills | Status: DC | PRN

## 2019-02-11 MED ORDER — MEPIVACAINE HCL (PF) 2 % IJ SOLN
INTRAMUSCULAR | Status: AC
  Filled 2019-02-11: qty 20

## 2019-02-11 MED ORDER — TRANEXAMIC ACID-NACL 1000-0.7 MG/100ML-% IV SOLN
1000.0000 mg | INTRAVENOUS | Status: AC
  Administered 2019-02-11: 1000 mg via INTRAVENOUS

## 2019-02-11 MED ORDER — SODIUM CHLORIDE 0.9 % IR SOLN
Status: DC | PRN
  Administered 2019-02-11: 1000 mL

## 2019-02-11 MED ORDER — OXYCODONE HCL 5 MG PO TABS
ORAL_TABLET | ORAL | Status: AC
  Filled 2019-02-11: qty 1

## 2019-02-11 MED ORDER — PROMETHAZINE HCL 25 MG/ML IJ SOLN: 6.2500 mg | INTRAMUSCULAR | Status: DC | PRN

## 2019-02-11 MED ORDER — ONDANSETRON HCL 4 MG/2ML IJ SOLN
INTRAMUSCULAR | Status: AC
  Filled 2019-02-11: qty 2

## 2019-02-11 MED ORDER — ACETAMINOPHEN 500 MG PO TABS
1000.0000 mg | ORAL_TABLET | Freq: Once | ORAL | Status: AC
  Administered 2019-02-11: 06:00:00 1000 mg via ORAL

## 2019-02-11 MED ORDER — LACTATED RINGERS IV SOLN: INTRAVENOUS | Status: DC

## 2019-02-11 MED ORDER — MEPIVACAINE HCL (PF) 2 % IJ SOLN
INTRAMUSCULAR | Status: DC | PRN
  Administered 2019-02-11: 3.2 mL via INTRATHECAL

## 2019-02-11 MED ORDER — FENTANYL CITRATE (PF) 100 MCG/2ML IJ SOLN
INTRAMUSCULAR | Status: AC
  Filled 2019-02-11: qty 2

## 2019-02-11 MED ORDER — CEFAZOLIN SODIUM-DEXTROSE 2-4 GM/100ML-% IV SOLN
INTRAVENOUS | Status: AC
  Filled 2019-02-11: qty 100

## 2019-02-11 MED ORDER — ONDANSETRON HCL 4 MG PO TABS
4.0000 mg | ORAL_TABLET | Freq: Four times a day (QID) | ORAL | Status: DC | PRN
  Filled 2019-02-11: qty 1

## 2019-02-11 MED ORDER — PROPOFOL 1000 MG/100ML IV EMUL
INTRAVENOUS | Status: AC
  Filled 2019-02-11: qty 100

## 2019-02-11 MED ORDER — KETOROLAC TROMETHAMINE 30 MG/ML IJ SOLN
INTRAMUSCULAR | Status: AC
  Filled 2019-02-11: qty 1

## 2019-02-11 MED ORDER — MIDAZOLAM HCL 2 MG/2ML IJ SOLN
INTRAMUSCULAR | Status: DC | PRN
  Administered 2019-02-11: 2 mg via INTRAVENOUS

## 2019-02-11 MED ORDER — POVIDONE-IODINE 10 % EX SWAB
2.0000 "application " | Freq: Once | CUTANEOUS | Status: AC
  Administered 2019-02-11: 2 via TOPICAL

## 2019-02-11 MED ORDER — HYDROMORPHONE HCL 1 MG/ML IJ SOLN
0.2500 mg | INTRAMUSCULAR | Status: DC | PRN
  Administered 2019-02-11 (×2): 0.5 mg via INTRAVENOUS

## 2019-02-11 MED ORDER — PROPOFOL 500 MG/50ML IV EMUL
INTRAVENOUS | Status: DC | PRN
  Administered 2019-02-11: 25 ug/kg/min via INTRAVENOUS

## 2019-02-11 MED ORDER — OXYCODONE HCL 5 MG PO TABS
5.0000 mg | ORAL_TABLET | Freq: Once | ORAL | Status: AC | PRN
  Administered 2019-02-11: 5 mg via ORAL

## 2019-02-11 MED ORDER — LIDOCAINE 2% (20 MG/ML) 5 ML SYRINGE
INTRAMUSCULAR | Status: AC
  Filled 2019-02-11: qty 5

## 2019-02-11 MED ORDER — LACTATED RINGERS IV BOLUS
500.0000 mL | Freq: Once | INTRAVENOUS | Status: AC
  Administered 2019-02-11: 500 mL via INTRAVENOUS

## 2019-02-11 MED ORDER — OXYCODONE HCL 5 MG/5ML PO SOLN: 5.0000 mg | Freq: Once | ORAL | Status: AC | PRN

## 2019-02-11 MED ORDER — KETOROLAC TROMETHAMINE 30 MG/ML IJ SOLN
30.0000 mg | Freq: Once | INTRAMUSCULAR | Status: AC | PRN
  Administered 2019-02-11: 10:00:00 30 mg via INTRAVENOUS

## 2019-02-11 MED ORDER — ONDANSETRON HCL 4 MG/2ML IJ SOLN
4.0000 mg | Freq: Four times a day (QID) | INTRAMUSCULAR | Status: DC | PRN
  Administered 2019-02-11: 4 mg via INTRAVENOUS

## 2019-02-11 MED ORDER — 0.9 % SODIUM CHLORIDE (POUR BTL) OPTIME
TOPICAL | Status: DC | PRN
  Administered 2019-02-11: 1000 mL

## 2019-02-11 MED ORDER — METHOCARBAMOL 500 MG IVPB - SIMPLE MED
500.0000 mg | Freq: Four times a day (QID) | INTRAVENOUS | Status: DC | PRN
  Administered 2019-02-11 (×2): 500 mg via INTRAVENOUS
  Filled 2019-02-11: qty 50
  Filled 2019-02-11: qty 500

## 2019-02-11 MED ORDER — TRANEXAMIC ACID-NACL 1000-0.7 MG/100ML-% IV SOLN
INTRAVENOUS | Status: AC
  Filled 2019-02-11: qty 100

## 2019-02-11 MED ORDER — METHOCARBAMOL 500 MG PO TABS: 500.0000 mg | ORAL_TABLET | Freq: Four times a day (QID) | ORAL | Status: DC | PRN

## 2019-02-11 MED ORDER — METHOCARBAMOL 500 MG IVPB - SIMPLE MED
INTRAVENOUS | Status: AC
  Filled 2019-02-11: qty 50

## 2019-02-11 MED ORDER — HYDROMORPHONE HCL 1 MG/ML IJ SOLN
0.5000 mg | INTRAMUSCULAR | Status: DC | PRN
  Administered 2019-02-11: 1 mg via INTRAVENOUS

## 2019-02-11 MED ORDER — FENTANYL CITRATE (PF) 100 MCG/2ML IJ SOLN
INTRAMUSCULAR | Status: DC | PRN
  Administered 2019-02-11: 100 ug via INTRAVENOUS

## 2019-02-11 MED ORDER — MEPERIDINE HCL 50 MG/ML IJ SOLN: 6.2500 mg | INTRAMUSCULAR | Status: DC | PRN

## 2019-02-11 MED ORDER — LACTATED RINGERS IV BOLUS
250.0000 mL | Freq: Once | INTRAVENOUS | Status: AC
  Administered 2019-02-11: 11:00:00 250 mL via INTRAVENOUS

## 2019-02-11 MED ORDER — OXYCODONE HCL 5 MG PO TABS
10.0000 mg | ORAL_TABLET | ORAL | Status: DC | PRN
  Administered 2019-02-11: 5 mg via ORAL
  Administered 2019-02-11: 10 mg via ORAL

## 2019-02-11 MED ORDER — ACETAMINOPHEN 500 MG PO TABS
ORAL_TABLET | ORAL | Status: AC
  Filled 2019-02-11: qty 2

## 2019-02-11 SURGICAL SUPPLY — 42 items
APL SKNCLS STERI-STRIP NONHPOA (GAUZE/BANDAGES/DRESSINGS)
BAG ZIPLOCK 12X15 (MISCELLANEOUS) IMPLANT
BALL HIP CERAMIC (Hips) ×1 IMPLANT
BENZOIN TINCTURE PRP APPL 2/3 (GAUZE/BANDAGES/DRESSINGS) IMPLANT
BLADE SAW SGTL 18X1.27X75 (BLADE) ×2 IMPLANT
BLADE SAW SGTL 18X1.27X75MM (BLADE) ×1
CLOSURE WOUND 1/2 X4 (GAUZE/BANDAGES/DRESSINGS)
COVER PERINEAL POST (MISCELLANEOUS) ×3 IMPLANT
COVER SURGICAL LIGHT HANDLE (MISCELLANEOUS) ×3 IMPLANT
COVER WAND RF STERILE (DRAPES) ×3 IMPLANT
CUP SECTOR GRIPTON 50MM (Cup) ×3 IMPLANT
DRAPE STERI IOBAN 125X83 (DRAPES) ×3 IMPLANT
DRAPE U-SHAPE 47X51 STRL (DRAPES) ×6 IMPLANT
DRSG AQUACEL AG ADV 3.5X10 (GAUZE/BANDAGES/DRESSINGS) ×3 IMPLANT
DURAPREP 26ML APPLICATOR (WOUND CARE) ×3 IMPLANT
ELECT REM PT RETURN 15FT ADLT (MISCELLANEOUS) ×3 IMPLANT
GAUZE XEROFORM 1X8 LF (GAUZE/BANDAGES/DRESSINGS) ×3 IMPLANT
GLOVE BIO SURGEON STRL SZ7.5 (GLOVE) ×3 IMPLANT
GLOVE BIOGEL PI IND STRL 8 (GLOVE) ×2 IMPLANT
GLOVE BIOGEL PI INDICATOR 8 (GLOVE) ×4
GLOVE ECLIPSE 8.0 STRL XLNG CF (GLOVE) ×3 IMPLANT
GOWN STRL REUS W/TWL XL LVL3 (GOWN DISPOSABLE) ×6 IMPLANT
HANDPIECE INTERPULSE COAX TIP (DISPOSABLE) ×3
HIP BALL CERAMIC (Hips) ×3 IMPLANT
HOLDER FOLEY CATH W/STRAP (MISCELLANEOUS) ×3 IMPLANT
KIT TURNOVER KIT A (KITS) IMPLANT
LINER ACETABULAR 32X50 (Liner) ×3 IMPLANT
PACK ANTERIOR HIP CUSTOM (KITS) ×3 IMPLANT
PENCIL SMOKE EVACUATOR (MISCELLANEOUS) IMPLANT
SET HNDPC FAN SPRY TIP SCT (DISPOSABLE) ×1 IMPLANT
STAPLER VISISTAT 35W (STAPLE) IMPLANT
STEM FEM SZ3 STD ACTIS (Stem) ×3 IMPLANT
STRIP CLOSURE SKIN 1/2X4 (GAUZE/BANDAGES/DRESSINGS) IMPLANT
SUT ETHIBOND NAB CT1 #1 30IN (SUTURE) ×3 IMPLANT
SUT ETHILON 2 0 PS N (SUTURE) IMPLANT
SUT MNCRL AB 4-0 PS2 18 (SUTURE) IMPLANT
SUT VIC AB 0 CT1 36 (SUTURE) ×3 IMPLANT
SUT VIC AB 1 CT1 36 (SUTURE) ×3 IMPLANT
SUT VIC AB 2-0 CT1 27 (SUTURE) ×6
SUT VIC AB 2-0 CT1 TAPERPNT 27 (SUTURE) ×2 IMPLANT
TRAY FOLEY MTR SLVR 16FR STAT (SET/KITS/TRAYS/PACK) IMPLANT
YANKAUER SUCT BULB TIP 10FT TU (MISCELLANEOUS) ×3 IMPLANT

## 2019-02-11 NOTE — Discharge Instructions (Signed)
General Anesthesia, Adult, Care After This sheet gives you information about how to care for yourself after your procedure. Your health care provider may also give you more specific instructions. If you have problems or questions, contact your health care provider. What can I expect after the procedure? After the procedure, the following side effects are common:  Pain or discomfort at the IV site.  Nausea.  Vomiting.  Sore throat.  Trouble concentrating.  Feeling cold or chills.  Weak or tired.  Sleepiness and fatigue.  Soreness and body aches. These side effects can affect parts of the body that were not involved in surgery. Follow these instructions at home:  For at least 24 hours after the procedure:  Have a responsible adult stay with you. It is important to have someone help care for you until you are awake and alert.  Rest as needed.  Do not: ? Participate in activities in which you could fall or become injured. ? Drive. ? Use heavy machinery. ? Drink alcohol. ? Take sleeping pills or medicines that cause drowsiness. ? Make important decisions or sign legal documents. ? Take care of children on your own. Eating and drinking  Follow any instructions from your health care provider about eating or drinking restrictions.  When you feel hungry, start by eating small amounts of foods that are soft and easy to digest (bland), such as toast. Gradually return to your regular diet.  Drink enough fluid to keep your urine pale yellow.  If you vomit, rehydrate by drinking water, juice, or clear broth. General instructions  If you have sleep apnea, surgery and certain medicines can increase your risk for breathing problems. Follow instructions from your health care provider about wearing your sleep device: ? Anytime you are sleeping, including during daytime naps. ? While taking prescription pain medicines, sleeping medicines, or medicines that make you drowsy.  Return to  your normal activities as told by your health care provider. Ask your health care provider what activities are safe for you.  Take over-the-counter and prescription medicines only as told by your health care provider.  If you smoke, do not smoke without supervision.  Keep all follow-up visits as told by your health care provider. This is important. Contact a health care provider if:  You have nausea or vomiting that does not get better with medicine.  You cannot eat or drink without vomiting.  You have pain that does not get better with medicine.  You are unable to pass urine.  You develop a skin rash.  You have a fever.  You have redness around your IV site that gets worse. Get help right away if:  You have difficulty breathing.  You have chest pain.  You have blood in your urine or stool, or you vomit blood. Summary  After the procedure, it is common to have a sore throat or nausea. It is also common to feel tired.  Have a responsible adult stay with you for the first 24 hours after general anesthesia. It is important to have someone help care for you until you are awake and alert.  When you feel hungry, start by eating small amounts of foods that are soft and easy to digest (bland), such as toast. Gradually return to your regular diet.  Drink enough fluid to keep your urine pale yellow.  Return to your normal activities as told by your health care provider. Ask your health care provider what activities are safe for you. This information is not   intended to replace advice given to you by your health care provider. Make sure you discuss any questions you have with your health care provider. Document Revised: 12/26/2016 Document Reviewed: 08/08/2016 Elsevier Patient Education  2020 [employer]  

## 2019-02-11 NOTE — Progress Notes (Signed)
Physical Therapy Treatment Patient Details Name: Donna Gonzales MRN: [id number] DOB: 10-10-1960 Today's Date: 02/11/2019    History of Present Illness Pt s/p R THR    PT Comments    Pt motivated and reports feeling better.  Pt ambulated increased distance but with onset nausea at ~25' and increasing.  Pt ambulated 33' before returning to sitting.  Will follow up with stair training.  Follow Up Recommendations  Follow surgeon's recommendation for DC plan and follow-up therapies     Equipment Recommendations  Rolling walker with 5" wheels    Recommendations for Other Services       Precautions / Restrictions Precautions Precautions: Fall Restrictions Weight Bearing Restrictions: No Other Position/Activity Restrictions: WBAT    Mobility  Bed Mobility Overal bed mobility: Needs Assistance Bed Mobility: Supine to Sit     Supine to sit: Min assist     General bed mobility comments: cues for sequence and use of L LE to self assist  Transfers Overall transfer level: Needs assistance Equipment used: Rolling walker (2 wheeled) Transfers: Sit to/from Stand Sit to Stand: Min guard         General transfer comment: cues for LE management and use of UEs to self assist  Ambulation/Gait Ambulation/Gait assistance: Min assist;Min guard Gait Distance (Feet): 52 Feet Assistive device: Rolling walker (2 wheeled) Gait Pattern/deviations: Step-to pattern;Decreased step length - right;Decreased step length - left;Shuffle;Trunk flexed Gait velocity: decr   General Gait Details: cues for sequence, posture and position from RW; distance ltd by nausea   Stairs             Wheelchair Mobility    Modified Rankin (Stroke Patients Only)       Balance Overall balance assessment: Mild deficits observed, not formally tested                                          Cognition Arousal/Alertness: Awake/alert Behavior During Therapy: WFL for tasks  assessed/performed Overall Cognitive Status: Within Functional Limits for tasks assessed                                        Exercises Total Joint Exercises Ankle Circles/Pumps: AROM;15 reps;Supine;Both Quad Sets: AROM;Both;5 reps;Supine Heel Slides: AAROM;Right;10 reps;Supine Hip ABduction/ADduction: AAROM;Right;10 reps;Supine    General Comments        Pertinent Vitals/Pain Pain Assessment: 0-10 Pain Score: 4  Faces Pain Scale: Hurts little more Pain Location: R hip Pain Descriptors / Indicators: Aching;Sore Pain Intervention(s): Monitored during session;Limited activity within patient's tolerance;Premedicated before session    Home Living Family/patient expects to be discharged to:: Private residence Living Arrangements: Spouse/significant other Available Help at Discharge: Family Type of Home: House Home Access: Stairs to enter Entrance Stairs-Rails: Right Home Layout: One level Home Equipment: Crutches      Prior Function Level of Independence: Independent          PT Goals (current goals can now be found in the care plan section) Acute Rehab PT Goals Patient Stated Goal: Regain IND ASAP PT Goal Formulation: With patient Time For Goal Achievement: 02/18/19 Potential to Achieve Goals: Good Progress towards PT goals: Progressing toward goals    Frequency    7X/week      PT Plan Current plan remains appropriate    Co-evaluation  AM-PAC PT "6 Clicks" Mobility   Outcome Measure  Help needed turning from your back to your side while in a flat bed without using bedrails?: A Little Help needed moving from lying on your back to sitting on the side of a flat bed without using bedrails?: A Little Help needed moving to and from a bed to a chair (including a wheelchair)?: A Little Help needed standing up from a chair using your arms (e.g., wheelchair or bedside chair)?: A Little Help needed to walk in hospital room?: A  Little Help needed climbing 3-5 steps with a railing? : A Little 6 Click Score: 18    End of Session Equipment Utilized During Treatment: Gait belt Activity Tolerance: Patient limited by fatigue;Other (comment) Patient left: in chair;with call bell/phone within reach Nurse Communication: Mobility status PT Visit Diagnosis: Difficulty in walking, not elsewhere classified (R26.2);Unsteadiness on feet (R26.81)     Time: 7494-4967 PT Time Calculation (min) (ACUTE ONLY): 12 min  Charges:  $Gait Training: 8-22 mins                     [place] Pager [phone] Office [phone]    Uzziel Russey 02/11/2019, 2:57 PM

## 2019-02-11 NOTE — H&P (Signed)
TOTAL HIP ADMISSION H&P  Patient is admitted for right total hip arthroplasty.  Subjective:  Chief Complaint: right hip pain  HPI: Donna Gonzales, 59 y.o. female, has a history of pain and functional disability in the right hip(s) due to arthritis and patient has failed non-surgical conservative treatments for greater than 12 weeks to include NSAID's and/or analgesics, corticosteriod injections, flexibility and strengthening excercises and activity modification.  Onset of symptoms was gradual starting 1 years ago with gradually worsening course since that time.The patient noted no past surgery on the right hip(s).  Patient currently rates pain in the right hip at 10 out of 10 with activity. Patient has night pain, worsening of pain with activity and weight bearing, pain that interfers with activities of daily living and pain with passive range of motion. Patient has evidence of subchondral cysts, subchondral sclerosis, periarticular osteophytes and joint space narrowing by imaging studies. This condition presents safety issues increasing the risk of falls.  There is no current active infection.  Patient Active Problem List   Diagnosis Date Noted  . Unilateral primary osteoarthritis, right hip 12/20/2018  . Arthritis of right hip 09/29/2018  . Right hip pain 08/24/2018  . Insomnia 02/13/2016  . Right lateral epicondylitis 10/02/2015  . 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 History of shingles 02/07/2015   Past Medical History:  Diagnosis Date  . Arthritis    right hip, mild left hip , left pinky finger   . Dupuytren contracture    bilateral ring fingers   . Vaginal delivery    two  . Vaginal yeast infection    history of chronic     Past Surgical History:  Procedure Laterality Date  . NO PAST SURGERIES      Current Facility-Administered Medications  Medication Dose Route Frequency Provider Last Rate Last Admin  . acetaminophen (TYLENOL) 500 MG tablet           .  ceFAZolin (ANCEF) 2-4 GM/100ML-% IVPB           . ceFAZolin (ANCEF) IVPB 2g/100 mL premix  2 g Intravenous On Call to OR Kirtland Bouchard, PA-C      . chlorhexidine (HIBICLENS) 4 % liquid 4 application  60 mL Topical Once Richardean Canal W, PA-C      . lactated ringers infusion   Intravenous Continuous Lacy Duverney M, DO      . tranexamic acid (CYKLOKAPRON) 1000MG /137mL IVPB           . tranexamic acid (CYKLOKAPRON) IVPB 1,000 mg  1,000 mg Intravenous To OR 80m, PA-C       Allergies  Allergen Reactions  . Other Hives and Swelling    Red Ants    Social History   Tobacco Use  . Smoking status: Never Smoker  . Smokeless tobacco: Never Used  Substance Use Topics  . Alcohol use: Yes    Alcohol/week: 14.0 standard drinks    Types: 14 Glasses of wine per week    Comment: a glass of wine each day     Family History  Problem Relation Age of Onset  . Heart disease Father        3 heart attacks  . Hypertension Father   . Cancer Father        some type of leukemia  . Hypertension Brother   . Cancer Brother        hodgkins     Review of Systems  Musculoskeletal: Positive for joint swelling.  All other systems reviewed and are negative.   Objective:  Physical Exam  Constitutional: She is oriented to person, place, and time. She appears well-developed and well-nourished.  HENT:  Head: Normocephalic and atraumatic.  Eyes: Pupils are equal, round, and reactive to light. EOM are normal.  Cardiovascular: Normal rate.  Respiratory: Effort normal.  Musculoskeletal:     Cervical back: Normal range of motion and neck supple.     Right hip: Tenderness and bony tenderness present. Decreased range of motion. Decreased strength.  Neurological: She is alert and oriented to person, place, and time.  Skin: Skin is warm and dry.  Psychiatric: She has a normal mood and affect.    Vital signs in last 24 hours: Temp:  [97.7 F (36.5 C)] 97.7 F (36.5 C) (02/05  0548) Pulse Rate:  [55] 55 (02/05 0548) Resp:  [11] 11 (02/05 0548) BP: (140)/(82) 140/82 (02/05 0548) SpO2:  [100 %] 100 % (02/05 0548) Weight:  [58.1 kg] 58.1 kg (02/05 0600)  Labs:   Estimated body mass index is 20.67 kg/m as calculated from the following:   Height as of this encounter: 5\' 6"  (1.676 m).   Weight as of this encounter: 58.1 kg.   Imaging Review Plain radiographs demonstrate severe degenerative joint disease of the right hip(s). The bone quality appears to be excellent for age and reported activity level.      Assessment/Plan:  End stage arthritis, right hip(s)  The patient history, physical examination, clinical judgement of the provider and imaging studies are consistent with end stage degenerative joint disease of the right hip(s) and total hip arthroplasty is deemed medically necessary. The treatment options including medical management, injection therapy, arthroscopy and arthroplasty were discussed at length. The risks and benefits of total hip arthroplasty were presented and reviewed. The risks due to aseptic loosening, infection, stiffness, dislocation/subluxation,  thromboembolic complications and other imponderables were discussed.  The patient acknowledged the explanation, agreed to proceed with the plan and consent was signed. Patient is being admitted for inpatient treatment for surgery, pain control, PT, OT, prophylactic antibiotics, VTE prophylaxis, progressive ambulation and ADL's and discharge planning.The patient is planning to be discharged home with home health services    Patient's anticipated LOS is less than 2 midnights, meeting these requirements: - Younger than 42 - Lives within 1 hour of care - Has a competent adult at home to recover with post-op recover - NO history of  - Chronic pain requiring opiods  - Diabetes  - Coronary Artery Disease  - Heart failure  - Heart attack  - Stroke  - DVT/VTE  - Cardiac arrhythmia  - Respiratory  Failure/COPD  - Renal failure  - Anemia  - Advanced Liver disease

## 2019-02-11 NOTE — Anesthesia Postprocedure Evaluation (Signed)
Anesthesia Post Note  Patient: Elliannah Wayment Borchardt  Procedure(s) Performed: RIGHT TOTAL HIP ARTHROPLASTY ANTERIOR APPROACH (Right Hip)     Patient location during evaluation: PACU Anesthesia Type: MAC and Spinal Level of consciousness: oriented and awake and alert Pain management: pain level controlled Vital Signs Assessment: post-procedure vital signs reviewed and stable Respiratory status: spontaneous breathing and respiratory function stable Cardiovascular status: blood pressure returned to baseline and stable Postop Assessment: no headache, no backache, no apparent nausea or vomiting and patient able to bend at knees Anesthetic complications: no Comments: Upon waking up pt stated to PACU RN that she had left sided chest pain. Ordered EKG, which was normal. Upon further questioning she states that it is sore when she takes a deep breath in. And then shortly after, stated that it actually has resolved on its own. No intervention, normal vitals.    Last Vitals:  Vitals:   02/11/19 0900 02/11/19 0915  BP: 113/70 110/67  Pulse: 65 (!) 58  Resp: 16 15  Temp:    SpO2: 100% 100%    Last Pain:  Vitals:   02/11/19 0845  TempSrc:   PainSc: [place]

## 2019-02-11 NOTE — Progress Notes (Signed)
Physical Therapy Treatment Patient Details Name: Donna Gonzales MRN: [id number] DOB: [birth year] Today's Date: 02/11/2019    History of Present Illness Pt s/p R THR    PT Comments    Pt continues very motivated to progress and dc home.  Pt able to negotiate up 2 stairs with rail and crutch but began actively vomiting on descent - RN aware.  Follow Up Recommendations  Follow surgeon's recommendation for DC plan and follow-up therapies     Equipment Recommendations  Rolling walker with 5" wheels    Recommendations for Other Services       Precautions / Restrictions Precautions Precautions: Fall Restrictions Weight Bearing Restrictions: No Other Position/Activity Restrictions: WBAT    Mobility  Bed Mobility                  Transfers Overall transfer level: Needs assistance Equipment used: Rolling walker (2 wheeled) Transfers: Sit to/from Stand Sit to Stand: Min guard         General transfer comment: cues for LE management and use of UEs to self assist  Ambulation/Gait Ambulation/Gait assistance: Min guard Gait Distance (Feet): 28 Feet Assistive device: Rolling walker (2 wheeled) Gait Pattern/deviations: Step-to pattern;Decreased step length - right;Decreased step length - left;Shuffle Gait velocity: decr   General Gait Details: cues for sequence, posture and position from RW; distance ltd by nausea/vomiting   Stairs Stairs: Yes Stairs assistance: Min assist Stair Management: One rail Right;Step to pattern;Forwards;With crutches Number of Stairs: 2 General stair comments: cues for sequence and foot/crutch placement   Wheelchair Mobility    Modified Rankin (Stroke Patients Only)       Balance Overall balance assessment: Mild deficits observed, not formally tested                                          Cognition Arousal/Alertness: Awake/alert Behavior During Therapy: WFL for tasks assessed/performed Overall Cognitive  Status: Within Functional Limits for tasks assessed                                        Exercises      General Comments        Pertinent Vitals/Pain Pain Assessment: 0-10 Pain Score: 4  Pain Location: R hip Pain Descriptors / Indicators: Aching;Sore Pain Intervention(s): Limited activity within patient's tolerance;Monitored during session;Premedicated before session    Home Living                      Prior Function            PT Goals (current goals can now be found in the care plan section) Acute Rehab PT Goals Patient Stated Goal: Regain IND ASAP PT Goal Formulation: With patient Time For Goal Achievement: 02/18/19 Potential to Achieve Goals: Good Progress towards PT goals: Progressing toward goals    Frequency    7X/week      PT Plan Current plan remains appropriate    Co-evaluation              AM-PAC PT "6 Clicks" Mobility   Outcome Measure  Help needed turning from your back to your side while in a flat bed without using bedrails?: A Little Help needed moving from lying on your back to sitting on the side of  a flat bed without using bedrails?: A Little Help needed moving to and from a bed to a chair (including a wheelchair)?: A Little Help needed standing up from a chair using your arms (e.g., wheelchair or bedside chair)?: A Little Help needed to walk in hospital room?: A Little Help needed climbing 3-5 steps with a railing? : A Little 6 Click Score: 18    End of Session Equipment Utilized During Treatment: Gait belt Activity Tolerance: Other (comment)(nausea/vomiting) Patient left: in chair;with call bell/phone within reach Nurse Communication: Mobility status PT Visit Diagnosis: Difficulty in walking, not elsewhere classified (R26.2);Unsteadiness on feet (R26.81)     Time: 7227-7375 PT Time Calculation (min) (ACUTE ONLY): 12 min  Charges:  $Gait Training: 8-22 mins                     Donna Gonzales  PT Acute Rehabilitation Services Pager [phone] Office [phone]    Donna Gonzales 02/11/2019, 5:21 PM

## 2019-02-11 NOTE — Brief Op Note (Signed)
02/11/2019  8:29 AM  PATIENT:  Donna Gonzales  59 y.o. female  PRE-OPERATIVE DIAGNOSIS:  osteoarthritis right hip  POST-OPERATIVE DIAGNOSIS:  osteoarthritis right hip  PROCEDURE:  Procedure(s): RIGHT TOTAL HIP ARTHROPLASTY ANTERIOR APPROACH (Right)  SURGEON:  Surgeon(s) and Role:    Kathryne Hitch, MD - Primary  PHYSICIAN ASSISTANT:  Rexene Edison, PA-C  ANESTHESIA:   spinal  EBL:  100 mL   COUNTS:  YES  DICTATION: .Other Dictation: Dictation Number [id number]  PLAN OF CARE: Discharge to home after PACU  PATIENT DISPOSITION:  PACU - hemodynamically stable.   Delay start of Pharmacological VTE agent (>24hrs) due to surgical blood loss or risk of bleeding: no

## 2019-02-11 NOTE — Evaluation (Signed)
Physical Therapy Evaluation Patient Details Name: Donna Gonzales MRN: [id number] DOB: March 29, 1960 Today's Date: 02/11/2019   History of Present Illness  Pt s/p R THR  Clinical Impression  Pt s/p R THR and presents with decreased R LE strength/ROM, post op pain and dizziness/nausea with OOB activity limiting functional mobility.  Pt should progress to dc home with family assist and states HHPT is set up to see her tomorrow.    Follow Up Recommendations Follow surgeon's recommendation for DC plan and follow-up therapies    Equipment Recommendations  Rolling walker with 5" wheels(already in room)    Recommendations for Other Services       Precautions / Restrictions Precautions Precautions: Fall Restrictions Weight Bearing Restrictions: No Other Position/Activity Restrictions: WBAT      Mobility  Bed Mobility Overal bed mobility: Needs Assistance Bed Mobility: Supine to Sit     Supine to sit: Min assist     General bed mobility comments: cues for sequence and use of L LE to self assist  Transfers Overall transfer level: Needs assistance Equipment used: Rolling walker (2 wheeled) Transfers: Sit to/from Stand Sit to Stand: Min assist         General transfer comment: cues for LE management and use of UEs to self assist  Ambulation/Gait Ambulation/Gait assistance: Min assist Gait Distance (Feet): 12 Feet Assistive device: Rolling walker (2 wheeled) Gait Pattern/deviations: Step-to pattern;Decreased step length - right;Decreased step length - left;Shuffle;Trunk flexed Gait velocity: decr   General Gait Details: cues for sequence, posture and position from RW; distance ltd by dizziness and nausea  Stairs            Wheelchair Mobility    Modified Rankin (Stroke Patients Only)       Balance Overall balance assessment: Mild deficits observed, not formally tested                                           Pertinent Vitals/Pain Pain  Assessment: Faces Faces Pain Scale: Hurts little more Pain Location: R hip Pain Descriptors / Indicators: Aching;Sore Pain Intervention(s): Limited activity within patient's tolerance;Monitored during session;Premedicated before session    [place] expects to be discharged to:: Private residence Living Arrangements: Spouse/significant other Available Help at Discharge: Family Type of Home: House Home Access: Stairs to enter Entrance Stairs-Rails: Right Entrance Stairs-Number of Steps: 4 Home Layout: One level Home Equipment: Crutches      Prior Function Level of Independence: Independent               Hand Dominance        Extremity/Trunk Assessment   Upper Extremity Assessment Upper Extremity Assessment: Overall WFL for tasks assessed    Lower Extremity Assessment Lower Extremity Assessment: RLE deficits/detail RLE Deficits / Details: 2+/5 hip strength with AAROM at hip to 90 flex and 15 abd    Cervical / Trunk Assessment Cervical / Trunk Assessment: Normal  Communication   Communication: No difficulties  Cognition Arousal/Alertness: Awake/alert Behavior During Therapy: WFL for tasks assessed/performed Overall Cognitive Status: Within Functional Limits for tasks assessed                                        General Comments      Exercises Total Joint Exercises Ankle Circles/Pumps: AROM;15  reps;Supine;Both Quad Sets: AROM;Both;5 reps;Supine Heel Slides: AAROM;Right;10 reps;Supine Hip ABduction/ADduction: AAROM;Right;10 reps;Supine   Assessment/Plan    PT Assessment Patient needs continued PT services  PT Problem List Decreased strength;Decreased range of motion;Decreased activity tolerance;Decreased balance;Decreased mobility;Decreased knowledge of use of DME;Pain       PT Treatment Interventions DME instruction;Gait training;Stair training;Functional mobility training;Therapeutic activities;Therapeutic  exercise;Patient/family education    PT Goals (Current goals can be found in the Care Plan section)  Acute Rehab PT Goals Patient Stated Goal: Regain IND ASAP PT Goal Formulation: With patient Time For Goal Achievement: 02/18/19 Potential to Achieve Goals: Good    Frequency 7X/week   Barriers to discharge        Co-evaluation               AM-PAC PT "6 Clicks" Mobility  Outcome Measure Help needed turning from your back to your side while in a flat bed without using bedrails?: A Little Help needed moving from lying on your back to sitting on the side of a flat bed without using bedrails?: A Little Help needed moving to and from a bed to a chair (including a wheelchair)?: A Little Help needed standing up from a chair using your arms (e.g., wheelchair or bedside chair)?: A Little Help needed to walk in hospital room?: A Little Help needed climbing 3-5 steps with a railing? : A Little 6 Click Score: 18    End of Session Equipment Utilized During Treatment: Gait belt Activity Tolerance: Patient limited by fatigue;Other (comment)(dizzy and nauseous) Patient left: in chair;with call bell/phone within reach Nurse Communication: Mobility status PT Visit Diagnosis: Difficulty in walking, not elsewhere classified (R26.2);Unsteadiness on feet (R26.81)    Time: 1240-1305 PT Time Calculation (min) (ACUTE ONLY): 25 min   Charges:   PT Evaluation $PT Eval Low Complexity: 1 Low          Mauro Kaufmann PT Acute Rehabilitation Services Pager [phone] Office [phone]   Sehar Sedano 02/11/2019, 1:24 PM

## 2019-02-11 NOTE — Care Plan (Signed)
RNCM called patient to discuss her upcoming Right Total hip arthroplasty with Dr. Magnus Ivan scheduled for Friday, 02/11/19. It is anticipated that patient will be a same day discharge after her surgery and MD requested CM assist with any needs. Patient has spousal support and will have assistance at home after surgery. She will need a FWW and doesn't feel she will need a 3in1/BSC. Referral to [employer] and DME to be delivered to hospital on day of surgery. Anticipate HHPT will be needed. Choice provided and referral made to [facility]. F/U appointment scheduled for 02/24/19 at 2:00 pm. All pre- and post-op instructions reviewed and patient allowed to ask questions. Will continue to assist with CM needs as they arise. Ralph Dowdy, RN,BSN,CCM [phone].

## 2019-02-11 NOTE — Transfer of Care (Signed)
Immediate Anesthesia Transfer of Care Note  Patient: Donna Gonzales  Procedure(s) Performed: RIGHT TOTAL HIP ARTHROPLASTY ANTERIOR APPROACH (Right Hip)  Patient Location: PACU  Anesthesia Type:MAC and Spinal  Level of Consciousness: awake, alert , oriented and patient cooperative  Airway & Oxygen Therapy: Patient Spontanous Breathing and Patient connected to face mask oxygen  Post-op Assessment: Report given to RN and Post -op Vital signs reviewed and stable  Post vital signs: Reviewed and stable  Last Vitals:  Vitals Value Taken Time  BP 114/63 02/11/19 0845  Temp 36.4 C 02/11/19 0845  Pulse 71 02/11/19 0848  Resp 12 02/11/19 0848  SpO2 100 % 02/11/19 0848  Vitals shown include unvalidated device data.  Last Pain:  Vitals:   02/11/19 0600  TempSrc:   PainSc: 0-No pain         Complications: No apparent anesthesia complications

## 2019-02-11 NOTE — Anesthesia Procedure Notes (Signed)
Spinal  Patient location during procedure: OR Start time: 02/11/2019 7:24 AM Staffing Performed: resident/CRNA  Anesthesiologist: Pervis Hocking, DO Resident/CRNA: Gerald Leitz, CRNA Preanesthetic Checklist Completed: patient identified, IV checked, site marked, risks and benefits discussed, surgical consent, monitors and equipment checked, pre-op evaluation and timeout performed Spinal Block Patient position: sitting Prep: ChloraPrep Patient monitoring: heart rate, continuous pulse ox, blood pressure and cardiac monitor Approach: midline Location: L3-4 Injection technique: single-shot Needle Needle type: Introducer and Pencan  Needle gauge: 25 G Needle length: 9 cm Assessment Sensory level: T4 Additional Notes Negative paresthesia. Negative blood return. Positive free-flowing CSF. Expiration date of kit checked and confirmed. Patient tolerated procedure well, without complications.

## 2019-02-11 NOTE — Op Note (Signed)
NAME: Gonzales, Donna K. MEDICAL RECORD VQ:[id number] ACCOUNT [id number] DATE OF BIRTH:Aug 15, 1960 FACILITY: [facility] LOCATION: [facility] PHYSICIAN:Shwanda Soltis Aretha Parrot, MD  OPERATIVE REPORT  DATE OF PROCEDURE:  02/11/2019  PREOPERATIVE DIAGNOSIS:  Primary osteoarthritis and degenerative joint disease, right hip.  POSTOPERATIVE DIAGNOSIS:  Primary osteoarthritis and degenerative joint disease, right hip.  PROCEDURE:  Right total hip arthroplasty through direct anterior approach.  IMPLANTS:  DePuy Sector Gription acetabular component size 50, size 32+0 neutral polyethylene liner, size 3 Actis femoral component with standard offset, size 32+1 ceramic hip ball.  SURGEON:  Vanita Panda. Magnus Ivan, MD  ASSISTANT:  Richardean Canal, PA-C  ANESTHESIA:  Spinal.  ANTIBIOTICS:  Two grams IV Ancef.  ESTIMATED BLOOD LOSS:  100 mL.  COMPLICATIONS:  None.  INDICATIONS:  The patient is a very pleasant 59 year old female with debilitating arthritis involving her right hip.  This has been well documented on radiographs and clinical exam.  Her pain is daily and it is detrimentally affecting her activities of  daily living, her quality of life and her mobility.  She has tried and failed all forms of conservative treatment.  At this point, she does wish to proceed with a total hip arthroplasty and we recommended this as well.  With this surgery, we have  described to her the risk of acute blood loss anemia, nerve or vessel injury, fracture, infection, dislocation, DVT and implant failure.  We talked about the goals of being decreased pain, improve mobility and overall improve quality of life.  DESCRIPTION OF PROCEDURE:  After informed consent was obtained and appropriate right hip was marked she was brought to the operating room and sat up on a stretcher where spinal anesthesia was obtained.  She was laid in supine position on a stretcher.  I  was able to assess her leg lengths and a Foley catheter was  placed.  Traction boots were then placed on both her feet and we placed her supine on the Hana fracture table, the perineal post in place and both legs in line skeletal traction device and no  traction applied.  I then assessed her hips and pelvis radiographically again, so we could get a good idea of what her true pelvis looks like.  Standing films in the office showed that her leg lengths were equal.  However, on clinical exam, she is  definitely shorter on the right operative side and so we need to correct for that intraoperatively.  Her right operative hip was then prepped and draped with DuraPrep and sterile drapes.  A time-out was called.  She was identified as correct patient,  correct right hip.  I then made an incision just inferior and posterior to the anterior superior iliac spine and carried this obliquely down the leg.  We dissected down tensor fascia lata muscle.  Tensor fascia was then divided longitudinally to proceed  with direct anterior approach to the hip.  We identified and cauterized circumflex vessels and identified the hip capsule, opened the hip capsule in an L-type format finding a very large joint effusion and significant synovitis around the right hip.  We  placed Cobra retractors around the medial and lateral femoral neck and made our femoral neck cut with an oscillating saw just proximal to the lesser trochanter.  We completed this with osteotome and placed a corkscrew guide in the femoral head and  removed the femoral head in its entirety and found a wide area devoid of cartilage.  I then placed a bent Hohmann over the medial acetabular  rim and removed remnants of the acetabular labrum and other debris.  I then began reaming under direct  visualization from a size 44 reamer in stepwise increments up to a size 49.  With all reamers under direct visualization, the last reamer was placed also under direct fluoroscopy, so I could obtain my depth of reaming by inclination and  anteversion.  I  then placed the real DePuy Sector Gription acetabular component size 50 and a 32+0 neutral polyethylene liner.  Attention was then turned to the femur.  With the leg externally rotated to 120 degrees, extended and adducted, we were able to place a  Mueller retractor medially and Hohman retractor behind the greater trochanter, we released lateral joint capsule and used a box-cutting osteotome to enter the femoral canal and a rongeur to lateralize.  We then began broaching using the Actis broaching  system from a size zero up to a size 3.  With a size 3 in place, we trialed a standard offset femoral neck and a 32+1 hip ball.  We rolled the leg back over and up and with traction and internal rotation, reducing the acetabulum and we felt that it was  stable but we needed just a little bit more leg length.  We dislocated the hip and removed the trial components.  We placed the real Actis femoral component size 3 with standard offset and we went with a 32+5 ceramic hip ball.  Again, reduced this in the  acetabulum and I was pleased with leg length, offset, range of motion and stability assessed radiographically and mechanically.  We then irrigated the soft tissue with normal saline solution using pulsatile lavage.  We closed the joint capsule with  interrupted #1 Ethibond suture.  A #1 Vicryl was used to close the tensor fascia, 0 Vicryl was used to close deep tissue, 2-0 Vicryl was used to close the subcutaneous tissue, and staples were used to reapproximate the skin.  Xeroform and Aquacel  dressing was applied.  She was taken off the [facility] table.  The Foley catheter was removed.  She was taken to recovery room in stable condition.  All final counts were correct.  No complications noted.  Of note, Benita Stabile, PA-C, assisted during the entire  case.  His assistance was crucial for facilitating all aspects of this case.  Postoperatively, if she does well, we will discharge her to home from the  recovery room this afternoon.  CN/NUANCE  D:02/11/2019 T:02/11/2019 JOB:[id number]

## 2019-02-13 DIAGNOSIS — M7711 Lateral epicondylitis, right elbow: Secondary | ICD-10-CM | POA: Diagnosis not present

## 2019-02-13 DIAGNOSIS — Z7982 Long term (current) use of aspirin: Secondary | ICD-10-CM | POA: Diagnosis not present

## 2019-02-13 DIAGNOSIS — M1612 Unilateral primary osteoarthritis, left hip: Secondary | ICD-10-CM | POA: Diagnosis not present

## 2019-02-13 DIAGNOSIS — M19042 Primary osteoarthritis, left hand: Secondary | ICD-10-CM | POA: Diagnosis not present

## 2019-02-13 DIAGNOSIS — Z471 Aftercare following joint replacement surgery: Secondary | ICD-10-CM | POA: Diagnosis not present

## 2019-02-13 DIAGNOSIS — G4700 Insomnia, unspecified: Secondary | ICD-10-CM | POA: Diagnosis not present

## 2019-02-13 DIAGNOSIS — Z96641 Presence of right artificial hip joint: Secondary | ICD-10-CM | POA: Diagnosis not present

## 2019-02-14 ENCOUNTER — Telehealth: Payer: Self-pay | Admitting: Orthopaedic Surgery

## 2019-02-14 ENCOUNTER — Encounter: Payer: Self-pay | Admitting: *Deleted

## 2019-02-14 NOTE — Telephone Encounter (Signed)
Marcelino Duster with [facility] called in wanting to let dr.blackman know that she got the pt started yesterday and will be seeing her 2 time a week for 2 weeks and 1 time a week for 3 weeks. No problems to report at this time. Give her a call if any questions   [phone]

## 2019-02-17 DIAGNOSIS — M7711 Lateral epicondylitis, right elbow: Secondary | ICD-10-CM | POA: Diagnosis not present

## 2019-02-17 DIAGNOSIS — M19042 Primary osteoarthritis, left hand: Secondary | ICD-10-CM | POA: Diagnosis not present

## 2019-02-17 DIAGNOSIS — G4700 Insomnia, unspecified: Secondary | ICD-10-CM | POA: Diagnosis not present

## 2019-02-17 DIAGNOSIS — Z96641 Presence of right artificial hip joint: Secondary | ICD-10-CM | POA: Diagnosis not present

## 2019-02-17 DIAGNOSIS — Z7982 Long term (current) use of aspirin: Secondary | ICD-10-CM | POA: Diagnosis not present

## 2019-02-17 DIAGNOSIS — Z471 Aftercare following joint replacement surgery: Secondary | ICD-10-CM | POA: Diagnosis not present

## 2019-02-17 DIAGNOSIS — M1612 Unilateral primary osteoarthritis, left hip: Secondary | ICD-10-CM | POA: Diagnosis not present

## 2019-02-22 DIAGNOSIS — G4700 Insomnia, unspecified: Secondary | ICD-10-CM | POA: Diagnosis not present

## 2019-02-22 DIAGNOSIS — Z7982 Long term (current) use of aspirin: Secondary | ICD-10-CM | POA: Diagnosis not present

## 2019-02-22 DIAGNOSIS — M7711 Lateral epicondylitis, right elbow: Secondary | ICD-10-CM | POA: Diagnosis not present

## 2019-02-22 DIAGNOSIS — Z471 Aftercare following joint replacement surgery: Secondary | ICD-10-CM | POA: Diagnosis not present

## 2019-02-22 DIAGNOSIS — M1612 Unilateral primary osteoarthritis, left hip: Secondary | ICD-10-CM | POA: Diagnosis not present

## 2019-02-22 DIAGNOSIS — M19042 Primary osteoarthritis, left hand: Secondary | ICD-10-CM | POA: Diagnosis not present

## 2019-02-22 DIAGNOSIS — Z96641 Presence of right artificial hip joint: Secondary | ICD-10-CM | POA: Diagnosis not present

## 2019-02-24 ENCOUNTER — Inpatient Hospital Stay: Payer: BC Managed Care – PPO | Admitting: Orthopaedic Surgery

## 2019-02-25 DIAGNOSIS — Z471 Aftercare following joint replacement surgery: Secondary | ICD-10-CM | POA: Diagnosis not present

## 2019-02-25 DIAGNOSIS — G4700 Insomnia, unspecified: Secondary | ICD-10-CM | POA: Diagnosis not present

## 2019-02-25 DIAGNOSIS — M7711 Lateral epicondylitis, right elbow: Secondary | ICD-10-CM | POA: Diagnosis not present

## 2019-02-25 DIAGNOSIS — Z7982 Long term (current) use of aspirin: Secondary | ICD-10-CM | POA: Diagnosis not present

## 2019-02-25 DIAGNOSIS — Z96641 Presence of right artificial hip joint: Secondary | ICD-10-CM | POA: Diagnosis not present

## 2019-02-25 DIAGNOSIS — M1612 Unilateral primary osteoarthritis, left hip: Secondary | ICD-10-CM | POA: Diagnosis not present

## 2019-02-25 DIAGNOSIS — M19042 Primary osteoarthritis, left hand: Secondary | ICD-10-CM | POA: Diagnosis not present

## 2019-02-28 ENCOUNTER — Other Ambulatory Visit: Payer: Self-pay

## 2019-02-28 ENCOUNTER — Encounter: Payer: Self-pay | Admitting: Orthopaedic Surgery

## 2019-02-28 ENCOUNTER — Ambulatory Visit (INDEPENDENT_AMBULATORY_CARE_PROVIDER_SITE_OTHER): Payer: BC Managed Care – PPO | Admitting: Orthopaedic Surgery

## 2019-02-28 DIAGNOSIS — Z96641 Presence of right artificial hip joint: Secondary | ICD-10-CM

## 2019-02-28 NOTE — Progress Notes (Signed)
HPI: Donna Gonzales returns 2-week status post right total hip arthroplasty.  She states she is overall doing well.  Feels her range of motion strength improving.  She is only taking ibuprofen.  She is using no assistive device.  She is having some pain down the lateral aspect of her hip to the knee.  Physical exam: Right hip good range of motion without pain.  Calf supple nontender.  Dorsiflexion plantarflexion ankle intact.  Surgical incisions healing well no signs of infection.  Slight seroma.  Impression: 2-week status post right total hip arthroplasty  Plan: She will work on scar tissue mobilization.  She is told to stop doing abduction exercises.  Continue 81 mg aspirin once daily for another week and then discontinue.  4 weeks sooner if there is any questions concerns.  The seroma becomes larger we could definitely drain this.

## 2019-03-02 ENCOUNTER — Encounter: Payer: BLUE CROSS/BLUE SHIELD | Admitting: Women's Health

## 2019-03-04 DIAGNOSIS — Z23 Encounter for immunization: Secondary | ICD-10-CM | POA: Diagnosis not present

## 2019-03-09 ENCOUNTER — Other Ambulatory Visit: Payer: Self-pay

## 2019-03-09 ENCOUNTER — Ambulatory Visit (INDEPENDENT_AMBULATORY_CARE_PROVIDER_SITE_OTHER): Payer: BC Managed Care – PPO | Admitting: Women's Health

## 2019-03-09 ENCOUNTER — Encounter: Payer: Self-pay | Admitting: Women's Health

## 2019-03-09 VITALS — BP 122/80 | Ht 65.0 in | Wt 124.0 lb

## 2019-03-09 DIAGNOSIS — Z13220 Encounter for screening for lipoid disorders: Secondary | ICD-10-CM | POA: Diagnosis not present

## 2019-03-09 DIAGNOSIS — E559 Vitamin D deficiency, unspecified: Secondary | ICD-10-CM

## 2019-03-09 DIAGNOSIS — Z01419 Encounter for gynecological examination (general) (routine) without abnormal findings: Secondary | ICD-10-CM

## 2019-03-09 NOTE — Progress Notes (Signed)
Donna Gonzales [birth year] [id number]    History:    Presents for annual exam.  Postmenopausal no HRT with no bleeding.  Normal Pap and mammogram history.  2017 - colonoscopy.  2018 normal DEXA.  Right hip replacement for osteoarthritis 02/2019.  Past medical history, past surgical history, family history and social history were all reviewed and documented in the EPIC chart.  Homemaker.  2 children both living in [place] and 1 grandson [name].  Has 7 siblings all doing well.  Father deceased from heart disease.  Mother healthy.  ROS:  A ROS was performed and pertinent positives and negatives are included.  Exam:  Vitals:   03/09/19 1200  BP: 122/80  Weight: 124 lb (56.2 kg)  Height: 5\' 5"  (1.651 m)   Body mass index is 20.63 kg/m.   General appearance:  Normal Thyroid:  Symmetrical, normal in size, without palpable masses or nodularity. Respiratory  Auscultation:  Clear without wheezing or rhonchi Cardiovascular  Auscultation:  Regular rate, without rubs, murmurs or gallops  Edema/varicosities:  Not grossly evident Abdominal  Soft,nontender, without masses, guarding or rebound.  Liver/spleen:  No organomegaly noted  Hernia:  None appreciated  Skin  Inspection:  Grossly normal   Breasts: Examined lying and sitting.     Right: Without masses, retractions, discharge or axillary adenopathy.     Left: Without masses, retractions, discharge or axillary adenopathy. Gentitourinary   Inguinal/mons:  Normal without inguinal adenopathy  External genitalia:  Normal  BUS/Urethra/Skene's glands:  Normal  Vagina:  Normal  Cervix:  Normal  Uterus:   normal in size, shape and contour.  Midline and mobile  Adnexa/parametria:     Rt: Without masses or tenderness.   Lt: Without masses or tenderness.  Anus and perineum: Normal  Digital rectal exam: Normal sphincter tone without palpated masses or tenderness  Assessment/Plan:  59 y.o. MWF G2 P2 for annual exam with no complaints of  vaginal discharge, urinary symptoms, or abdominal pain.  Postmenopausal/no HRT/no bleeding 02/2019 right hip replacement-healing as scheduled follow-up  Plan: SBEs, continue annual 3D screening mammogram, calcium rich foods, vitamin D 2000 IUs daily.  Shingrix at 60 encouraged, has had Zostavax.  Has had a recent Tdap and has had Covid.  Looking forward to getting back to an active lifestyle of regular exercise.  Normal CBC and CMP, will check lipid panel and vitamin D today.  Pap normal 02/2016, new screening guidelines reviewed.   03/2016 [facility], 12:17 PM 03/09/2019

## 2019-03-09 NOTE — Patient Instructions (Addendum)
It was good to see you today Vitamin D 2000 IUs daily Shingrix vaccine 2 series shingles prevention vaccine at 60  Health Maintenance for Postmenopausal Women Menopause is a normal process in which your ability to get pregnant comes to an end. This process happens slowly over many months or years, usually between the ages of 46 and 76. Menopause is complete when you have missed your menstrual periods for 12 months. It is important to talk with your health care provider about some of the most common conditions that affect women after menopause (postmenopausal women). These include heart disease, cancer, and bone loss (osteoporosis). Adopting a healthy lifestyle and getting preventive care can help to promote your health and wellness. The actions you take can also lower your chances of developing some of these common conditions. What should I know about menopause? During menopause, you may get a number of symptoms, such as:  Hot flashes. These can be moderate or severe.  Night sweats.  Decrease in sex drive.  Mood swings.  Headaches.  Tiredness.  Irritability.  Memory problems.  Insomnia. Choosing to treat or not to treat these symptoms is a decision that you make with your health care provider. Do I need hormone replacement therapy?  Hormone replacement therapy is effective in treating symptoms that are caused by menopause, such as hot flashes and night sweats.  Hormone replacement carries certain risks, especially as you become older. If you are thinking about using estrogen or estrogen with progestin, discuss the benefits and risks with your health care provider. What is my risk for heart disease and stroke? The risk of heart disease, heart attack, and stroke increases as you age. One of the causes may be a change in the body's hormones during menopause. This can affect how your body uses dietary fats, triglycerides, and cholesterol. Heart attack and stroke are medical emergencies.  There are many things that you can do to help prevent heart disease and stroke. Watch your blood pressure  High blood pressure causes heart disease and increases the risk of stroke. This is more likely to develop in people who have high blood pressure readings, are of African descent, or are overweight.  Have your blood pressure checked: ? Every 3-5 years if you are 33-43 years of age. ? Every year if you are 70 years old or older. Eat a healthy diet   Eat a diet that includes plenty of vegetables, fruits, low-fat dairy products, and lean protein.  Do not eat a lot of foods that are high in solid fats, added sugars, or sodium. Get regular exercise Get regular exercise. This is one of the most important things you can do for your health. Most adults should:  Try to exercise for at least 150 minutes each week. The exercise should increase your heart rate and make you sweat (moderate-intensity exercise).  Try to do strengthening exercises at least twice each week. Do these in addition to the moderate-intensity exercise.  Spend less time sitting. Even light physical activity can be beneficial. Other tips  Work with your health care provider to achieve or maintain a healthy weight.  Do not use any products that contain nicotine or tobacco, such as cigarettes, e-cigarettes, and chewing tobacco. If you need help quitting, ask your health care provider.  Know your numbers. Ask your health care provider to check your cholesterol and your blood sugar (glucose). Continue to have your blood tested as directed by your health care provider. Do I need screening for cancer?  Depending on your health history and family history, you may need to have cancer screening at different stages of your life. This may include screening for:  Breast cancer.  Cervical cancer.  Lung cancer.  Colorectal cancer. What is my risk for osteoporosis? After menopause, you may be at increased risk for osteoporosis.  Osteoporosis is a condition in which bone destruction happens more quickly than new bone creation. To help prevent osteoporosis or the bone fractures that can happen because of osteoporosis, you may take the following actions:  If you are 48-65 years old, get at least 1,000 mg of calcium and at least 600 mg of vitamin D per day.  If you are older than age 28 but younger than age 14, get at least 1,200 mg of calcium and at least 600 mg of vitamin D per day.  If you are older than age 32, get at least 1,200 mg of calcium and at least 800 mg of vitamin D per day. Smoking and drinking excessive alcohol increase the risk of osteoporosis. Eat foods that are rich in calcium and vitamin D, and do weight-bearing exercises several times each week as directed by your health care provider. How does menopause affect my mental health? Depression may occur at any age, but it is more common as you become older. Common symptoms of depression include:  Low or sad mood.  Changes in sleep patterns.  Changes in appetite or eating patterns.  Feeling an overall lack of motivation or enjoyment of activities that you previously enjoyed.  Frequent crying spells. Talk with your health care provider if you think that you are experiencing depression. General instructions See your health care provider for regular wellness exams and vaccines. This may include:  Scheduling regular health, dental, and eye exams.  Getting and maintaining your vaccines. These include: ? Influenza vaccine. Get this vaccine each year before the flu season begins. ? Pneumonia vaccine. ? Shingles vaccine. ? Tetanus, diphtheria, and pertussis (Tdap) booster vaccine. Your health care provider may also recommend other immunizations. Tell your health care provider if you have ever been abused or do not feel safe at home. Summary  Menopause is a normal process in which your ability to get pregnant comes to an end.  This condition causes  hot flashes, night sweats, decreased interest in sex, mood swings, headaches, or lack of sleep.  Treatment for this condition may include hormone replacement therapy.  Take actions to keep yourself healthy, including exercising regularly, eating a healthy diet, watching your weight, and checking your blood pressure and blood sugar levels.  Get screened for cancer and depression. Make sure that you are up to date with all your vaccines. This information is not intended to replace advice given to you by your health care provider. Make sure you discuss any questions you have with your health care provider. Document Revised: 12/16/2017 Document Reviewed: 12/16/2017 Elsevier Patient Education  2020 [employer].

## 2019-03-10 LAB — VITAMIN D 25 HYDROXY (VIT D DEFICIENCY, FRACTURES): Vit D, 25-Hydroxy: 33 ng/mL (ref 30–100)

## 2019-03-10 LAB — LIPID PANEL
Cholesterol: 216 mg/dL — ABNORMAL HIGH (ref <200)
HDL: 89 mg/dL (ref >=50)
LDL Cholesterol (Calc): 113 mg/dL (calc) — ABNORMAL HIGH
Non-HDL Cholesterol (Calc): 127 mg/dL (calc) (ref <130)
Total CHOL/HDL Ratio: 2.4 (calc) (ref <5.0)
Triglycerides: 58 mg/dL (ref <150)

## 2019-03-28 ENCOUNTER — Ambulatory Visit (INDEPENDENT_AMBULATORY_CARE_PROVIDER_SITE_OTHER): Payer: BC Managed Care – PPO | Admitting: Orthopaedic Surgery

## 2019-03-28 ENCOUNTER — Encounter: Payer: Self-pay | Admitting: Orthopaedic Surgery

## 2019-03-28 ENCOUNTER — Other Ambulatory Visit: Payer: Self-pay

## 2019-03-28 DIAGNOSIS — Z96641 Presence of right artificial hip joint: Secondary | ICD-10-CM

## 2019-03-28 NOTE — Progress Notes (Signed)
The patient comes in today at 6 weeks status post a right total hip arthroplasty.  She says she is doing well and has range of motion and strength increasing daily.  She is walking more.  She still experiencing some swelling.  She is still experiencing some numbness and tingling but overall feels like she is making progress as do I.  On exam her incision looks great.  The swelling is minimal.  She does have decreased sensation around the incision itself and some lateral hip pain but overall she looks great.  I gave her reassurance that she is doing everything that we have asked and she is making progress the way I would expect her to make at 6 weeks postoperative.  I do not need to see her back at this point for 6 months.  At that visit I like a standing low AP pelvis and lateral right operative hip.  Obviously if there are issues before then she will come and let us know.

## 2019-03-30 DIAGNOSIS — Z23 Encounter for immunization: Secondary | ICD-10-CM | POA: Diagnosis not present

## 2019-04-07 ENCOUNTER — Telehealth: Payer: Self-pay | Admitting: *Deleted

## 2019-04-07 NOTE — Telephone Encounter (Signed)
Patient called requesting Rx for Ambien 10 mg tablet, reports you prescribed before in past. Takes mainly for travel, and as needed for sleep at home as well.

## 2019-04-07 NOTE — Telephone Encounter (Signed)
Ok for reflll thanks

## 2019-04-11 MED ORDER — ZOLPIDEM TARTRATE 10 MG PO TABS: 10.0000 mg | ORAL_TABLET | Freq: Every evening | ORAL | 0 refills | Status: DC | PRN

## 2019-04-11 NOTE — Telephone Encounter (Signed)
Rx called in 

## 2019-06-28 ENCOUNTER — Encounter: Payer: Self-pay | Admitting: Women's Health

## 2019-06-28 DIAGNOSIS — Z1231 Encounter for screening mammogram for malignant neoplasm of breast: Secondary | ICD-10-CM | POA: Diagnosis not present

## 2019-09-28 ENCOUNTER — Encounter: Payer: Self-pay | Admitting: Orthopaedic Surgery

## 2019-09-28 ENCOUNTER — Ambulatory Visit: Payer: BC Managed Care – PPO | Admitting: Orthopaedic Surgery

## 2019-09-28 ENCOUNTER — Ambulatory Visit: Payer: Self-pay

## 2019-09-28 DIAGNOSIS — M1611 Unilateral primary osteoarthritis, right hip: Secondary | ICD-10-CM

## 2019-09-28 DIAGNOSIS — Z96641 Presence of right artificial hip joint: Secondary | ICD-10-CM | POA: Diagnosis not present

## 2019-09-28 NOTE — Progress Notes (Signed)
The patient is now over 7 months status post a right total hip arthroplasty through direct anterior bridge.  She is a very active and young 59 year old female.  She still having some stiffness when she leans over and perform squatting but otherwise is doing well.  On exam I can put her right hip through full internal and external rotation with no difficulty at all.  Her leg lengths are equal.  She has excellent hip strength.  A low AP pelvis lateral the right hip shows excellent positioning of the hip replacement in terms of alignment and there is no evidence of loosening.  There is bone ingrowth.  At this point follow-up can be as needed for the hip.  If she develops any type of pain at all there is a dull aching pain I would like to see her back.  We can always see her back for any other orthopedic issues.  All questions and concerns were answered and addressed.

## 2019-10-12 DIAGNOSIS — L2389 Allergic contact dermatitis due to other agents: Secondary | ICD-10-CM | POA: Diagnosis not present

## 2019-11-02 DIAGNOSIS — M25552 Pain in left hip: Secondary | ICD-10-CM | POA: Diagnosis not present

## 2019-11-02 DIAGNOSIS — L2389 Allergic contact dermatitis due to other agents: Secondary | ICD-10-CM | POA: Diagnosis not present

## 2019-11-02 DIAGNOSIS — D692 Other nonthrombocytopenic purpura: Secondary | ICD-10-CM | POA: Diagnosis not present

## 2019-11-02 DIAGNOSIS — M25551 Pain in right hip: Secondary | ICD-10-CM | POA: Diagnosis not present

## 2019-11-04 DIAGNOSIS — M25551 Pain in right hip: Secondary | ICD-10-CM | POA: Diagnosis not present

## 2019-11-04 DIAGNOSIS — M25552 Pain in left hip: Secondary | ICD-10-CM | POA: Diagnosis not present

## 2019-11-18 DIAGNOSIS — M25551 Pain in right hip: Secondary | ICD-10-CM | POA: Diagnosis not present

## 2019-11-18 DIAGNOSIS — M25552 Pain in left hip: Secondary | ICD-10-CM | POA: Diagnosis not present

## 2019-11-24 DIAGNOSIS — M25551 Pain in right hip: Secondary | ICD-10-CM | POA: Diagnosis not present

## 2019-11-24 DIAGNOSIS — M25552 Pain in left hip: Secondary | ICD-10-CM | POA: Diagnosis not present

## 2019-12-12 ENCOUNTER — Ambulatory Visit (INDEPENDENT_AMBULATORY_CARE_PROVIDER_SITE_OTHER): Payer: BC Managed Care – PPO | Admitting: Orthopaedic Surgery

## 2019-12-12 ENCOUNTER — Encounter: Payer: Self-pay | Admitting: Orthopaedic Surgery

## 2019-12-12 ENCOUNTER — Other Ambulatory Visit: Payer: Self-pay

## 2019-12-12 ENCOUNTER — Ambulatory Visit: Payer: Self-pay

## 2019-12-12 DIAGNOSIS — Z96641 Presence of right artificial hip joint: Secondary | ICD-10-CM

## 2019-12-12 DIAGNOSIS — M25551 Pain in right hip: Secondary | ICD-10-CM

## 2019-12-12 MED ORDER — MELOXICAM 15 MG PO TABS: 15.0000 mg | ORAL_TABLET | Freq: Every day | ORAL | 1 refills | Status: DC

## 2019-12-12 NOTE — Progress Notes (Signed)
The patient is a very pleasant 59 year old female that we replaced her right hip in February of this year.  She is incredibly active and unfortunately had developed significant arthritis in her right hip.  She had tried failed all forms of conservative treatment.  The hip replacement went well.  I was pleased with her leg lengths.  She comes in today though with worsening right hip pain and she points to the hip flexor area in the groin is the source of her pain.  She has been active and is worked on activity modification.  She does do a lot of exercising.  She is taken occasional over-the-counter anti-inflammatory but that has not really helped.  She denies any symptoms of instability with the right hip.  Examination with her laying supine shows that her leg lengths are completely equal.  She does have a lot of pain in that right hip with the extremes of hip flexion and is in the front of the hip.  It does move smoothly on rotation.  Her left native hip flexes further with no pain.  She does report occasional hamstring issues on both sides.  An AP pelvis and lateral of the right hip shows a well-seated total hip arthroplasty with no complicating features.  She may be experiencing some tendinitis around the hip itself in the iliopsoas tendon.  I would like to put her on meloxicam once a day and have her work on activity modification.  I would also like to send her to Dr. Alvester Morin since she is not a diabetic to have him try a steroid injection in the iliopsoas tendon area of the right hip to see what this does for inflammation and pain.  He can then get her back to me about 2 to 3 weeks after that injection.  She agrees with this treatment plan.  All question concerns were answered and addressed.

## 2020-01-10 ENCOUNTER — Ambulatory Visit: Payer: Self-pay

## 2020-01-10 ENCOUNTER — Other Ambulatory Visit: Payer: Self-pay

## 2020-01-10 ENCOUNTER — Encounter: Payer: Self-pay | Admitting: Physical Medicine and Rehabilitation

## 2020-01-10 ENCOUNTER — Ambulatory Visit: Payer: 59 | Admitting: Physical Medicine and Rehabilitation

## 2020-01-10 DIAGNOSIS — M7071 Other bursitis of hip, right hip: Secondary | ICD-10-CM

## 2020-01-10 DIAGNOSIS — M25551 Pain in right hip: Secondary | ICD-10-CM | POA: Diagnosis not present

## 2020-01-10 DIAGNOSIS — Z96641 Presence of right artificial hip joint: Secondary | ICD-10-CM

## 2020-01-10 DIAGNOSIS — M76899 Other specified enthesopathies of unspecified lower limb, excluding foot: Secondary | ICD-10-CM | POA: Diagnosis not present

## 2020-01-10 NOTE — Progress Notes (Signed)
Right hip pain- inner thigh and groin. Worse with straightening up after bending. Left hip pain which hurts with sitting.  Numeric Pain Rating Scale and Functional Assessment Average Pain 9   In the last MONTH (on 0-10 scale) has pain interfered with the following?  1. General activity like being  able to carry out your everyday physical activities such as walking, climbing stairs, carrying groceries, or moving a chair?  Rating(9)    -Dye Allergies.

## 2020-01-11 ENCOUNTER — Encounter: Payer: Self-pay | Admitting: Physical Medicine and Rehabilitation

## 2020-01-11 MED ORDER — TRIAMCINOLONE ACETONIDE 40 MG/ML IJ SUSP
60.0000 mg | INTRAMUSCULAR | Status: AC | PRN
  Administered 2020-01-10: 60 mg via INTRA_ARTICULAR

## 2020-01-11 MED ORDER — BUPIVACAINE HCL 0.25 % IJ SOLN
4.0000 mL | INTRAMUSCULAR | Status: AC | PRN
  Administered 2020-01-10: 4 mL via INTRA_ARTICULAR

## 2020-01-11 NOTE — Progress Notes (Signed)
Donna Gonzales - 60 y.o. female MRN [id number]  Date of birth: [birth year]  Office Visit Note: Visit Date: 01/10/2020 PCP: Huel Cote, NP (Inactive) Referred by: Mcarthur Rossetti*  Subjective: Chief Complaint  Patient presents with  . Right Hip - Pain  . Left Hip - Pain   HPI: Donna Gonzales is a 60 y.o. female who comes in today For evaluation and management at her request for bilateral hip pain buttock pain and upper hamstring pain.  Her brief history can be totally reviewed in Dr. Trevor Mace notes.  She has undergone total hip arthroplasty on the right in February of this year with actually good relief of her posterior symptoms but with continued pain with forward flexion and bending.  Pain is anterior across the hip joint into the thigh.  Dr. Trevor Mace notes were pretty clear that he suggested iliopsoas injection diagnostically.  I guess talking with her she has a lot of questions about her left hip and posterior buttock pain and really pain along the insertion of the hamstrings.  She has had physical therapy and medication management and activity modification still with continued symptoms and a lot of questions on the sources of her pain.  She was originally followed in sports medicine and had MRI of the hip performed which is reviewed today and reviewed below in the notes.  The left hip pain is worse with sitting it is in the posterior buttock region sometimes into the groin but more from a posterior aspect and not an anterior.  She denies any pain down the legs or numbness or tingling.  No history of lumbar spine issues or surgery.  No advanced imaging of the lumbar spine.  I had a discussion with her today about the diagnostic use of the injection for trying to determine the source of pain at least if we can get some relief during the injection and gives an indication of what is going on and that was Dr. Trevor Mace reasoning.  Review of Systems  Musculoskeletal: Positive  for joint pain.       Buttock and hamstring pain  All other systems reviewed and are negative.  Otherwise per HPI.  Assessment & Plan: Visit Diagnoses:    ICD-10-CM   1. Right hip pain  M25.551 XR C-ARM NO REPORT  2. History of total right hip arthroplasty  Z96.641   3. 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  Dr. Ninfa Linden has been instrumental in helping her with total hip replacement on the right which did alleviate a lot of the symptoms posteriorly.  She has similar symptoms on the left posteriorly.  She does not really have any hip and groin pain on the left and none with hip rotation.  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  This is despite aggressive physical therapy at times.  For some reason she really wanted to be evaluated even though I do agree with Dr. Ninfa Linden fully that diagnostic iliopsoas injection is probably the first step on the right.  This is a well-known issue at times with hip replacement in general with still continued pain across that joint.  In her case is probably would be better performed with ultrasound guidance but we  will be able to do that today with fluoroscopic guidance.  I have also suggested a one-time injection in a couple of weeks of the left ischial bursa/tendon.  We will schedule her for that injection.  I did tell her that I do not have much to add and I think at this point her best course of action is to continue to follow along with Dr. Magnus Ivan who knows way more about the hips and structures around this area than I do.   She may wish to undergo a different physical therapist with probably concentrating on eccentric type strengthening of the tendon along with consideration through may be Dr.Hilts  with prolotherapy or PRP.    Meds & Orders: No orders of the defined types were placed in this encounter.   Orders Placed This Encounter  Procedures  . Large Joint Inj  . XR C-ARM NO REPORT    Follow-up: No follow-ups on file.   Procedures: Alert scopic Nedra Hai guided iliopsoas peritendinous injection (Right iliopsoas) on 01/10/2020 10:10 AM Indications: diagnostic evaluation and pain Details: 22 G 3.5 in needle, fluoroscopy-guided anterior approach  Arthrogram: No  Medications: 4 mL bupivacaine 0.25 %; 60 mg triamcinolone acetonide 40 MG/ML Outcome: tolerated well, no immediate complications  There was excellent flow of contrast over the anterior central part of the arthroplasty shadow.  This did seem to show spread consistent with muscle tendinous spread of contrast.  Consideration in the future for ultrasound guidance.  She did get some relief with forward flexion pain after the injection. Procedure, treatment alternatives, risks and benefits explained, specific risks discussed. Consent was given by the patient. Immediately prior to procedure a time out was called to verify the correct patient, procedure, equipment, support staff and site/side marked as required. Patient was prepped and draped in the usual sterile fashion.          Clinical History: MRI OF THE RIGHT HIP WITH CONTRAST  TECHNIQUE: Multiplanar, multisequence MR imaging was performed following the administration of intra-articular contrast.  CONTRAST:  Intra-articular contrast  COMPARISON:  None.  FINDINGS: Bones: There is no evidence of acute fracture, dislocation or avascular necrosis. The femoral head is normal in shape, configuration and sphericity. No osseous bump at the femoral head neck junction. The visualized bony pelvis appears normal. The visualized sacroiliac joints and symphysis pubis appear normal.  Articular cartilage and labrum  Articular cartilage: There is moderate superior joint  space loss with subchondral cystic changes and marginal osteophyte formation.  Labrum: Attenuation and fraying of the anterior superior labrum is seen.  Joint or bursal effusion  Joint effusion: There is scattered debris seen within the joint space.  Bursae: No focal periarticular fluid collection.  Muscles and tendons  Muscles and tendons: There is partially visualized feathery signal seen visualized at the lateral corner of the on gluteal musculature. The visualized gluteus, hamstring and iliopsoas tendons appear normal. The piriformis muscles appear symmetric.  Other findings  Miscellaneous: The visualized internal pelvic contents appear unremarkable.  IMPRESSION: 1. Moderate right hip osteoarthritis with anterior superior labral degeneration/fraying. No definite displaced labral tear. 2. Partially visualized gluteal muscular edema/strain. 3. Mild debris within the joint space which could be suggestive of synovitis.   Electronically Signed   By: Jonna Clark M.D.   On: 12/08/2018 21:52   She reports that she has never smoked. She has never used smokeless tobacco. No results for input(s): HGBA1C, LABURIC in the last 8760 hours.  Objective:  VS:  HT:  WT:   BMI:     BP:   HR: bpm  TEMP: ( )  RESP:  Physical Exam Vitals and nursing note reviewed.  Constitutional:      General: She is not in acute distress.    Appearance: Normal appearance. She is normal weight. She is not ill-appearing.  HENT:     Head: Normocephalic and atraumatic.     Right Ear: External ear normal.     Left Ear: External ear normal.  Eyes:     Extraocular Movements: Extraocular movements intact.  Cardiovascular:     Rate and Rhythm: Normal rate.     Pulses: Normal pulses.  Pulmonary:     Effort: Pulmonary effort is normal. No respiratory distress.  Abdominal:     General: There is no distension.     Palpations: Abdomen is soft.  Musculoskeletal:        General:  Tenderness present.     Cervical back: Neck supple.     Right lower leg: No edema.     Left lower leg: No edema.     Comments: Patient has good distal strength with no pain over the greater trochanters.  No clonus or focal weakness.  She has no real pain with internal rotation of the hips.  She does have some pain on the left with external rotation.  Pain is really over the ischial tuberosity and not over the PSIS.  She does have tenderness to palpation there as well on both sides.  Skin:    Findings: No erythema, lesion or rash.  Neurological:     General: No focal deficit present.     Mental Status: She is alert and oriented to person, place, and time.     Sensory: No sensory deficit.     Motor: No weakness or abnormal muscle tone.     Coordination: Coordination normal.  Psychiatric:        Mood and Affect: Mood normal.        Behavior: Behavior normal.     Ortho Exam  Imaging: XR C-ARM NO REPORT  Result Date: 01/10/2020 Please see Notes tab for imaging impression.   Past Medical/Family/Surgical/Social History: Medications & Allergies reviewed per EMR, new medications updated. Patient Active Problem List   Diagnosis Date Noted  . Unilateral primary osteoarthritis, right hip 12/20/2018  . Arthritis of right hip 09/29/2018  . Right hip pain 08/24/2018  . Insomnia 02/13/2016  . Right lateral epicondylitis 10/02/2015  . 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 History of shingles 02/07/2015   Past Medical History:  Diagnosis Date  . Arthritis    right hip, mild left hip , left pinky finger   . Dupuytren contracture    bilateral ring fingers   . Vaginal delivery    two  . Vaginal yeast infection    history of chronic    Family History  Problem Relation Age of Onset  . Heart disease Father        3 heart attacks  . Hypertension Father   . Cancer Father        some type of leukemia  . Hypertension Brother   . Cancer Brother        hodgkins   Past Surgical  History:  Procedure Laterality Date  . NO PAST SURGERIES    . TOTAL HIP ARTHROPLASTY Right 02/11/2019   Procedure: RIGHT TOTAL HIP ARTHROPLASTY ANTERIOR APPROACH;  Surgeon: Kathryne Hitch, MD;  Location: [facility];  Service: Orthopedics;  Laterality:  Right;   Social History   Occupational History  . Not on file  Tobacco Use  . Smoking status: Never Smoker  . Smokeless tobacco: Never Used  Vaping Use  . Vaping Use: Never used  Substance and Sexual Activity  . Alcohol use: Yes    Alcohol/week: 14.0 standard drinks    Types: 14 Glasses of wine per week    Comment: a glass of wine each day   . Drug use: No  . Sexual activity: Yes    Birth control/protection: Other-see comments    Comment: vasectomy, intercourse age 54, sexual partners less than 5

## 2020-01-26 ENCOUNTER — Ambulatory Visit: Payer: BC Managed Care – PPO | Admitting: Physical Medicine and Rehabilitation

## 2020-03-13 ENCOUNTER — Encounter: Payer: BC Managed Care – PPO | Admitting: Nurse Practitioner

## 2020-03-26 ENCOUNTER — Encounter: Payer: Self-pay | Admitting: Orthopaedic Surgery

## 2020-03-26 ENCOUNTER — Ambulatory Visit: Payer: 59 | Admitting: Orthopaedic Surgery

## 2020-03-26 DIAGNOSIS — M25551 Pain in right hip: Secondary | ICD-10-CM

## 2020-03-26 DIAGNOSIS — Z96641 Presence of right artificial hip joint: Secondary | ICD-10-CM

## 2020-03-26 NOTE — Progress Notes (Signed)
Donna Gonzales is an incredibly athletic 60 year old female who is just over a year out from a right anterior hip replacement.  In early January we had Dr. Alvester Morin provide a steroid injection in the right iliopsoas tendon.  She says she was pain-free for about a week.  She has been resting more which is helpful but she still has issues with pain in that hip area especially the buttocks and hamstrings on the right side.  She had had some hamstring issues in the past remotely on the left side.  She is a thin and athletic individual.  She walks without an assistive device or limp.  Her leg lengths are equal.  Her hip on the left side moves fluidly.  Her right operative hip also moves fluidly.  She has excellent strength with her quads and not allowed difficulty with hip flexion well.  There is some pain with extremes of rotation of the right hip.  At this point I would like to send her to outpatient physical therapy with any modalities including dry needling that can help with iliopsoas tendinitis as well as ischial bursitis and hamstring origin tendinitis on the right side.  There is no restrictions from my standpoint in terms of what the therapist can try.  We will then see her back in 6 weeks to see how she is doing overall.  She did ask appropriate questions about imaging.  An MRI could should too much artifact but that would be something to consider but we would need to try physical therapy first because is not really an operative intervention that I am recommending and I think therapy will help her the most.  All questions and concerns were answered and addressed.  We will see her back in 6 weeks to see how she is doing overall and hopefully she will have responded to some therapy.

## 2020-03-27 ENCOUNTER — Encounter: Payer: Self-pay | Admitting: Nurse Practitioner

## 2020-03-27 ENCOUNTER — Ambulatory Visit: Payer: 59 | Admitting: Nurse Practitioner

## 2020-03-27 ENCOUNTER — Other Ambulatory Visit: Payer: Self-pay

## 2020-03-27 VITALS — BP 120/70 | HR 68 | Ht 65.75 in | Wt 122.0 lb

## 2020-03-27 DIAGNOSIS — Z01419 Encounter for gynecological examination (general) (routine) without abnormal findings: Secondary | ICD-10-CM | POA: Diagnosis not present

## 2020-03-27 LAB — CBC WITH DIFFERENTIAL/PLATELET
Absolute Monocytes: 448 cells/uL (ref 200–950)
Basophils Absolute: 50 cells/uL (ref 0–200)
Basophils Relative: 0.9 %
Eosinophils Absolute: 39 cells/uL (ref 15–500)
Eosinophils Relative: 0.7 %
HCT: 38.3 % (ref 35.0–45.0)
Hemoglobin: 12.7 g/dL (ref 11.7–15.5)
Lymphs Abs: 2094 cells/uL (ref 850–3900)
MCH: 32.6 pg (ref 27.0–33.0)
MCHC: 33.2 g/dL (ref 32.0–36.0)
MCV: 98.2 fL (ref 80.0–100.0)
MPV: 10.1 fL (ref 7.5–12.5)
Monocytes Relative: 8 %
Neutro Abs: 2968 cells/uL (ref 1500–7800)
Neutrophils Relative %: 53 %
Platelets: 315 10*3/uL (ref 140–400)
RBC: 3.9 10*6/uL (ref 3.80–5.10)
RDW: 12.2 % (ref 11.0–15.0)
Total Lymphocyte: 37.4 %
WBC: 5.6 10*3/uL (ref 3.8–10.8)

## 2020-03-27 LAB — COMPREHENSIVE METABOLIC PANEL
AG Ratio: 1.9 (calc) (ref 1.0–2.5)
ALT: 16 U/L (ref 6–29)
AST: 26 U/L (ref 10–35)
Albumin: 4.7 g/dL (ref 3.6–5.1)
Alkaline phosphatase (APISO): 36 U/L — ABNORMAL LOW (ref 37–153)
BUN: 17 mg/dL (ref 7–25)
CO2: 28 mmol/L (ref 20–32)
Calcium: 9.7 mg/dL (ref 8.6–10.4)
Chloride: 101 mmol/L (ref 98–110)
Creat: 0.73 mg/dL (ref 0.50–1.05)
Globulin: 2.5 g/dL (calc) (ref 1.9–3.7)
Glucose, Bld: 96 mg/dL (ref 65–99)
Potassium: 4.2 mmol/L (ref 3.5–5.3)
Sodium: 138 mmol/L (ref 135–146)
Total Bilirubin: 1.5 mg/dL — ABNORMAL HIGH (ref 0.2–1.2)
Total Protein: 7.2 g/dL (ref 6.1–8.1)

## 2020-03-27 LAB — LIPID PANEL
Cholesterol: 233 mg/dL — ABNORMAL HIGH (ref <200)
HDL: 109 mg/dL (ref >=50)
LDL Cholesterol (Calc): 110 mg/dL (calc) — ABNORMAL HIGH
Non-HDL Cholesterol (Calc): 124 mg/dL (calc) (ref <130)
Total CHOL/HDL Ratio: 2.1 (calc) (ref <5.0)
Triglycerides: 59 mg/dL (ref <150)

## 2020-03-27 NOTE — Patient Instructions (Signed)

## 2020-03-27 NOTE — Progress Notes (Signed)
   Donna Gonzales 09-22-1960 [id number]   History:  60 y.o. G2P2 presents for annual exam without GYN complaints. Postmenopausal - no HRT, no bleeding. Normal pap and mammogram history.   Gynecologic History Patient's last menstrual period was 11/27/2010.   Contraception/Family planning: post menopausal status  Health Maintenance Last Pap: 02/23/2016. Results were: normal Last mammogram: 06/2019. Results were: normal Last colonoscopy: 2017 Last Dexa: 2018. Results were: normal  Past medical history, past surgical history, family history and social history were all reviewed and documented in the EPIC chart.  ROS:  A ROS was performed and pertinent positives and negatives are included.  Exam:  Vitals:   03/27/20 1341  BP: 120/70  Pulse: 68  Weight: 122 lb (55.3 kg)  Height: 5' 5.75" (1.67 m)   Body mass index is 19.84 kg/m.  General appearance:  Normal Thyroid:  Symmetrical, normal in size, without palpable masses or nodularity. Respiratory  Auscultation:  Clear without wheezing or rhonchi Cardiovascular  Auscultation:  Regular rate, without rubs, murmurs or gallops  Edema/varicosities:  Not grossly evident Abdominal  Soft,nontender, without masses, guarding or rebound.  Liver/spleen:  No organomegaly noted  Hernia:  None appreciated  Skin  Inspection:  Grossly normal   Breasts: Examined lying and sitting.   Right: Without masses, retractions, discharge or axillary adenopathy.   Left: Without masses, retractions, discharge or axillary adenopathy. Gentitourinary   Inguinal/mons:  Normal without inguinal adenopathy  External genitalia:  Normal  BUS/Urethra/Skene's glands:  Normal  Vagina:  Normal  Cervix:  Normal  Uterus:  Normal in size, shape and contour.  Midline and mobile  Adnexa/parametria:     Rt: Without masses or tenderness.   Lt: Without masses or tenderness.  Anus and perineum: Normal  Digital rectal exam: Normal sphincter tone without palpated  masses or tenderness  Assessment/Plan:  60 y.o. G2P2 for annual exam.   Well female exam with routine gynecological exam - Plan: CBC with Differential/Platelet, Comprehensive metabolic panel, Lipid panel. Education provided on SBEs, importance of preventative screenings, current guidelines, high calcium diet, regular exercise, and multivitamin daily.   Screening for cervical cancer - Normal Pap history.  Will repeat at 5-year interval per guidelines.  Screening for breast cancer - Normal mammogram history.  Continue annual screenings.  Normal breast exam today.  Screening for colon cancer - 2017 colonoscopy. Will repeat at GI's recommended interval.   Return in 1 year for annual.    Olivia Mackie DNP, 1:54 PM 03/27/2020

## 2021-04-03 ENCOUNTER — Ambulatory Visit: Payer: 59 | Admitting: Nurse Practitioner

## 2021-04-23 NOTE — Progress Notes (Signed)
? ?Donna Gonzales 01-13-1960 [id number] ? ? ?History:  61 y.o. G2P2 presents for annual exam without GYN complaints. Complains of difficulty falling asleep and staying asleep. She has taken Ambien in the past sparingly. She is currently taking Unisom and magnesium nightly. She has also tried melatonin. She has a good sleep hygiene routine but she has just never been a good sleeper. Postmenopausal - no HRT, no bleeding. Normal pap and mammogram history.  ? ?Gynecologic History ?Patient's last menstrual period was 11/27/2010. ?  ?Contraception/Family planning: post menopausal status ?Sexually active: Yes ? ?Health Maintenance ?Last Pap: 02/23/2016. Results were: Normal ?Last mammogram: 07/03/2020. Results were: Normal ?Last colonoscopy: 04/13/2015. Results were: Normal ,10-year recall ?Last Dexa: 03/21/2016. Results were: Normal ? ?Past medical history, past surgical history, family history and social history were all reviewed and documented in the EPIC chart. Married. Lives at [facility]. Son lives in [place], has 2 yo Donna Gonzales and newborn Donna Gonzales. Daughter getting married April 2023.  ? ?ROS:  A ROS was performed and pertinent positives and negatives are included. ? ?Exam: ? ?Vitals:  ? 04/24/21 0918  ?BP: 120/74  ?Weight: 122 lb (55.3 kg)  ?Height: 5\' 5"  (1.651 m)  ? ? ?Body mass index is 20.3 kg/m?. ? ?General appearance:  Normal ?Thyroid:  Symmetrical, normal in size, without palpable masses or nodularity. ?Respiratory ? Auscultation:  Clear without wheezing or rhonchi ?Cardiovascular ? Auscultation:  Regular rate, without rubs, murmurs or gallops ? Edema/varicosities:  Not grossly evident ?Abdominal ? Soft,nontender, without masses, guarding or rebound. ? Liver/spleen:  No organomegaly noted ? Hernia:  None appreciated ? Skin ? Inspection:  Grossly normal ?  ?Breasts: Examined lying and sitting.  ? Right: Without masses, retractions, discharge or axillary adenopathy. ? ? Left: Without masses, retractions,  discharge or axillary adenopathy. ?Genitourinary  ? Inguinal/mons:  Normal without inguinal adenopathy ? External genitalia:  Normal appearing vulva with no masses, tenderness, or lesions ? BUS/Urethra/Skene's glands:  Normal ? Vagina:  Normal appearing with normal color and discharge, no lesions ? Cervix:  Normal appearing without discharge or lesions ? Uterus:  Normal in size, shape and contour.  Midline and mobile, nontender ? Adnexa/parametria:   ?  Rt: Normal in size, without masses or tenderness. ?  Lt: Normal in size, without masses or tenderness. ? Anus and perineum: Normal ? Digital rectal exam: Normal sphincter tone without palpated masses or tenderness ? ?Patient informed chaperone available to be present for breast and pelvic exam. Patient has requested no chaperone to be present. Patient has been advised what will be completed during breast and pelvic exam.  ? ?Assessment/Plan:  61 y.o. G2P2 for annual exam.  ? ?Well female exam with routine gynecological exam - Plan: Cytology - PAP( Donna Gonzales), CBC with Differential/Platelet, Comprehensive metabolic panel, TSH. Education provided on SBEs, importance of preventative screenings, current guidelines, high calcium diet, regular exercise, and multivitamin daily.  ? ?Postmenopausal - Plan: DG Bone Density. No HRT, no bleeding.  ? ?Hyperlipidemia, unspecified hyperlipidemia type - Plan: Lipid panel ? ?Primary insomnia - Plan: zolpidem (AMBIEN) 5 MG tablet as needed for sleep. She is aware of addictive properties and to use sparingly. Discussed good sleep hygiene. ? ?Screening for cervical cancer - Normal Pap history. Pap with HR HPV today.  ? ?Screening for breast cancer - Normal mammogram history.  Continue annual screenings.  Normal breast exam today. ? ?Screening for colon cancer - 2017 colonoscopy. Will repeat at 10-year interval per GI's recommendation. ? ?Screening  for osteoporosis - Normal bone density in 2018. Will repeat DXA now. She is very active  with golf and pickle ball.  ? ?Return in 1 year for annual.  ? ? ?Donna Mackie DNP, 9:44 AM 04/24/2021 ? ?

## 2021-04-24 ENCOUNTER — Other Ambulatory Visit (HOSPITAL_COMMUNITY)
Admission: RE | Admit: 2021-04-24 | Discharge: 2021-04-24 | Disposition: A | Discharge status: Home / Self Care | Payer: 59 | Source: Ambulatory Visit | Attending: Nurse Practitioner | Admitting: Nurse Practitioner

## 2021-04-24 ENCOUNTER — Ambulatory Visit (INDEPENDENT_AMBULATORY_CARE_PROVIDER_SITE_OTHER): Payer: 59 | Admitting: Nurse Practitioner

## 2021-04-24 ENCOUNTER — Encounter: Payer: Self-pay | Admitting: Nurse Practitioner

## 2021-04-24 VITALS — BP 120/74 | Ht 65.0 in | Wt 122.0 lb

## 2021-04-24 DIAGNOSIS — E785 Hyperlipidemia, unspecified: Secondary | ICD-10-CM

## 2021-04-24 DIAGNOSIS — F5101 Primary insomnia: Secondary | ICD-10-CM | POA: Diagnosis not present

## 2021-04-24 DIAGNOSIS — Z780 Asymptomatic menopausal state: Secondary | ICD-10-CM

## 2021-04-24 DIAGNOSIS — Z01419 Encounter for gynecological examination (general) (routine) without abnormal findings: Secondary | ICD-10-CM | POA: Diagnosis present

## 2021-04-24 MED ORDER — ZOLPIDEM TARTRATE 5 MG PO TABS: 5.0000 mg | ORAL_TABLET | Freq: Every evening | ORAL | 0 refills | Status: DC | PRN

## 2021-04-25 LAB — COMPREHENSIVE METABOLIC PANEL
AG Ratio: 1.7 (calc) (ref 1.0–2.5)
ALT: 20 U/L (ref 6–29)
AST: 32 U/L (ref 10–35)
Albumin: 4.8 g/dL (ref 3.6–5.1)
Alkaline phosphatase (APISO): 38 U/L (ref 37–153)
BUN: 14 mg/dL (ref 7–25)
CO2: 28 mmol/L (ref 20–32)
Calcium: 9.8 mg/dL (ref 8.6–10.4)
Chloride: 103 mmol/L (ref 98–110)
Creat: 0.82 mg/dL (ref 0.50–1.03)
Globulin: 2.9 g/dL (calc) (ref 1.9–3.7)
Glucose, Bld: 98 mg/dL (ref 65–99)
Potassium: 5 mmol/L (ref 3.5–5.3)
Sodium: 140 mmol/L (ref 135–146)
Total Bilirubin: 1.4 mg/dL — ABNORMAL HIGH (ref 0.2–1.2)
Total Protein: 7.7 g/dL (ref 6.1–8.1)

## 2021-04-25 LAB — LIPID PANEL
Cholesterol: 249 mg/dL — ABNORMAL HIGH (ref <200)
HDL: 137 mg/dL (ref >=50)
LDL Cholesterol (Calc): 98 mg/dL (calc)
Non-HDL Cholesterol (Calc): 112 mg/dL (calc) (ref <130)
Total CHOL/HDL Ratio: 1.8 (calc) (ref <5.0)
Triglycerides: 55 mg/dL (ref <150)

## 2021-04-25 LAB — TSH: TSH: 2.78 mIU/L (ref 0.40–4.50)

## 2021-04-25 LAB — CBC WITH DIFFERENTIAL/PLATELET
Absolute Monocytes: 455 cells/uL (ref 200–950)
Basophils Absolute: 60 cells/uL (ref 0–200)
Basophils Relative: 1.3 %
Eosinophils Absolute: 78 cells/uL (ref 15–500)
Eosinophils Relative: 1.7 %
HCT: 40.3 % (ref 35.0–45.0)
Hemoglobin: 13.4 g/dL (ref 11.7–15.5)
Lymphs Abs: 2006 cells/uL (ref 850–3900)
MCH: 32.7 pg (ref 27.0–33.0)
MCHC: 33.3 g/dL (ref 32.0–36.0)
MCV: 98.3 fL (ref 80.0–100.0)
MPV: 10.3 fL (ref 7.5–12.5)
Monocytes Relative: 9.9 %
Neutro Abs: 2001 cells/uL (ref 1500–7800)
Neutrophils Relative %: 43.5 %
Platelets: 304 10*3/uL (ref 140–400)
RBC: 4.1 10*6/uL (ref 3.80–5.10)
RDW: 12.3 % (ref 11.0–15.0)
Total Lymphocyte: 43.6 %
WBC: 4.6 10*3/uL (ref 3.8–10.8)

## 2021-04-25 LAB — CYTOLOGY - PAP
Comment: NEGATIVE
Diagnosis: NEGATIVE
High risk HPV: NEGATIVE

## 2021-05-21 ENCOUNTER — Ambulatory Visit (INDEPENDENT_AMBULATORY_CARE_PROVIDER_SITE_OTHER): Payer: 59

## 2021-05-21 ENCOUNTER — Other Ambulatory Visit: Payer: Self-pay | Admitting: Nurse Practitioner

## 2021-05-21 DIAGNOSIS — Z13820 Encounter for screening for osteoporosis: Secondary | ICD-10-CM | POA: Diagnosis not present

## 2021-05-21 DIAGNOSIS — Z780 Asymptomatic menopausal state: Secondary | ICD-10-CM

## 2021-07-05 ENCOUNTER — Encounter: Payer: Self-pay | Admitting: Nurse Practitioner

## 2021-11-15 IMAGING — XA DG FLUORO GUIDE NDL PLC/BX
2 series · 2 of 2 positions shown · non-contrast
Comparison: none

CLINICAL DATA: Chronic right hip pain.

[Series 1: ortho standard · 1 of 1 slices shown (1 of 2)]
[im 1/1]
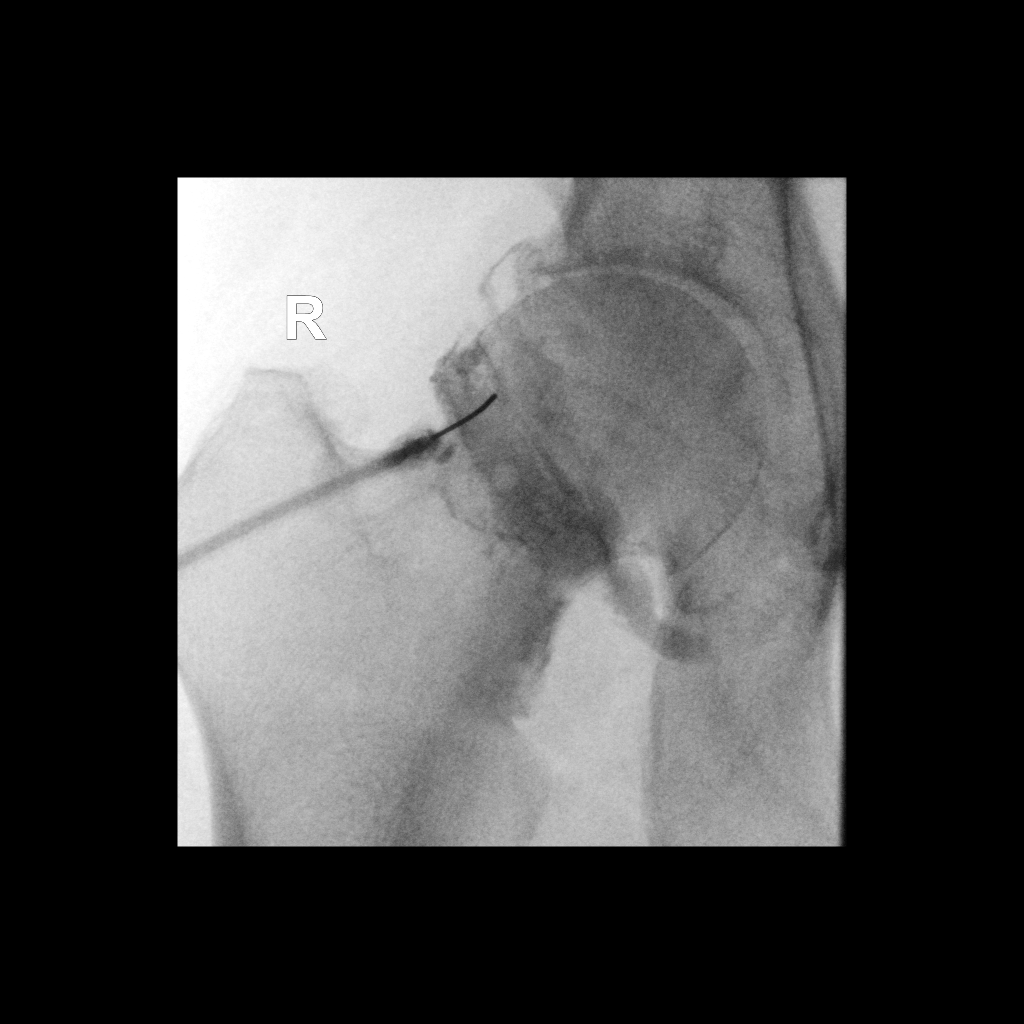

[Series 2: ortho standard · 1 of 1 slices shown (2 of 2)]
[im 1/1]
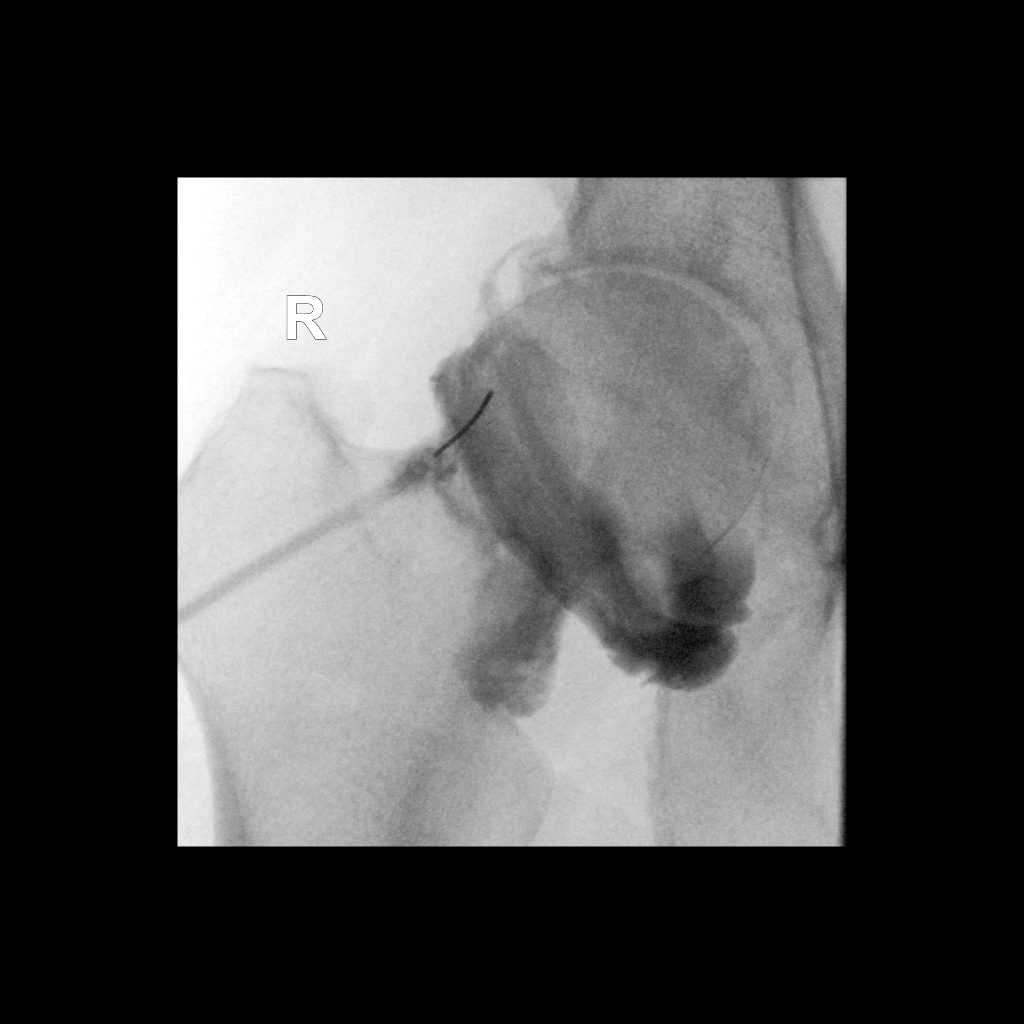

[2 of 2 positions shown; findings below may reference images not displayed]

FLUOROSCOPY TIME:  Radiation Exposure Index (as provided by the
fluoroscopic device): 0.9 mGy

Fluoroscopy Time:  4 seconds

Number of Acquired Images:  0

PROCEDURE:
The risks and benefits of the procedure were discussed with the
patient, and written informed consent was obtained. The patient
stated no history of allergy to contrast media. A formal timeout
procedure was performed with the patient according to departmental
protocol.

The patient was placed supine on the fluoroscopy table and the right
hip joint was identified under fluoroscopy. The skin overlying the
right hip joint was subsequently cleaned with Betadine and a sterile
drape was placed over the area of interest. 2 ml 1% Lidocaine was
used to anesthetize the skin around the needle insertion site.

A 22 gauge spinal needle was inserted into the right hip joint under
fluoroscopy.

12 ml of gadolinium mixture (0.1 ml of Multihance mixed with 10 ml
of Isovue-M 200 contrast and 10 ml of sterile saline) were injected
into the right hip joint.

The needle was removed and hemostasis was achieved. The patient was
subsequently transferred to MRI for imaging.
IMPRESSION: Technically successful right hip injection for MRI.

## 2021-11-22 ENCOUNTER — Other Ambulatory Visit: Payer: Self-pay | Admitting: *Deleted

## 2021-11-22 DIAGNOSIS — F5101 Primary insomnia: Secondary | ICD-10-CM

## 2021-11-22 NOTE — Telephone Encounter (Signed)
Patient called requesting refill on Ambien 5 mg tablet takes for sleep, will be leaving for a trip and will need Rx for trip. Patient will be leaving in 1 week.  Rx pending to be sighed.

## 2021-11-25 MED ORDER — ZOLPIDEM TARTRATE 5 MG PO TABS: 5.0000 mg | ORAL_TABLET | Freq: Every evening | ORAL | 0 refills | Status: DC | PRN

## 2022-04-29 ENCOUNTER — Ambulatory Visit: Payer: 59 | Admitting: Nurse Practitioner

## 2022-05-26 ENCOUNTER — Encounter: Payer: Self-pay | Admitting: Nurse Practitioner

## 2022-05-26 ENCOUNTER — Ambulatory Visit (INDEPENDENT_AMBULATORY_CARE_PROVIDER_SITE_OTHER): Payer: 59 | Admitting: Nurse Practitioner

## 2022-05-26 VITALS — BP 124/62 | HR 45 | Ht 65.25 in | Wt 119.0 lb

## 2022-05-26 DIAGNOSIS — F5101 Primary insomnia: Secondary | ICD-10-CM | POA: Diagnosis not present

## 2022-05-26 DIAGNOSIS — Z780 Asymptomatic menopausal state: Secondary | ICD-10-CM | POA: Diagnosis not present

## 2022-05-26 DIAGNOSIS — E785 Hyperlipidemia, unspecified: Secondary | ICD-10-CM

## 2022-05-26 DIAGNOSIS — Z01419 Encounter for gynecological examination (general) (routine) without abnormal findings: Secondary | ICD-10-CM

## 2022-05-26 MED ORDER — ZOLPIDEM TARTRATE 5 MG PO TABS: 5.0000 mg | ORAL_TABLET | Freq: Every evening | ORAL | 0 refills | Status: DC | PRN

## 2022-05-26 NOTE — Progress Notes (Signed)
   Donna Gonzales [birth year] [id number]   History:  62 y.o. G2P2 presents for annual exam without GYN complaints. Postmenopausal - no HRT, no bleeding. Normal pap and mammogram history. Takes Ambien occasionally for sleep.  Gynecologic History Patient's last menstrual period was 11/27/2010.   Contraception/Family planning: post menopausal status Sexually active: Yes  Health Maintenance Last Pap: 04/24/2021. Results were: Normal neg HPV, 5-year repeat Last mammogram: 07/04/2021. Results were: Normal Last colonoscopy: 04/13/2015. Results were: Normal ,10-year recall Last Dexa: 05/21/2021. Results were: Normal  Past medical history, past surgical history, family history and social history were all reviewed and documented in the EPIC chart. Married. Lives at [facility]. Son lives in [place], has 3 yo Donna Gonzales and 60 yo Donna Gonzales. Daughter just got married in April, also lives in [place].  ROS:  A ROS was performed and pertinent positives and negatives are included.  Exam:  Vitals:   05/26/22 0927  BP: 124/62  Pulse: (!) 45  SpO2: 100%  Weight: 119 lb (54 kg)  Height: 5' 5.25" (1.657 m)     Body mass index is 19.65 kg/m.  General appearance:  Normal Thyroid:  Symmetrical, normal in size, without palpable masses or nodularity. Respiratory  Auscultation:  Clear without wheezing or rhonchi Cardiovascular  Auscultation:  Regular rate, without rubs, murmurs or gallops  Edema/varicosities:  Not grossly evident Abdominal  Soft,nontender, without masses, guarding or rebound.  Liver/spleen:  No organomegaly noted  Hernia:  None appreciated  Skin  Inspection:  Grossly normal   Breasts: Examined lying and sitting.   Right: Without masses, retractions, discharge or axillary adenopathy.   Left: Without masses, retractions, discharge or axillary adenopathy. Genitourinary   Inguinal/mons:  Normal without inguinal adenopathy  External genitalia:  Normal appearing vulva with no  masses, tenderness, or lesions  BUS/Urethra/Skene's glands:  Normal  Vagina:  Normal appearing with normal color and discharge, no lesions. Atrophic changes.   Cervix:  Normal appearing without discharge or lesions  Uterus:  Normal in size, shape and contour.  Midline and mobile, nontender  Adnexa/parametria:     Rt: Normal in size, without masses or tenderness.   Lt: Normal in size, without masses or tenderness.  Anus and perineum: Normal  Digital rectal exam: Deferred  Patient informed chaperone available to be present for breast and pelvic exam. Patient has requested no chaperone to be present. Patient has been advised what will be completed during breast and pelvic exam.   Assessment/Plan:  62 y.o. G2P2 for annual exam.   Well female exam with routine gynecological exam - Plan: CBC with Differential/Platelet, Comprehensive metabolic panel. Education provided on SBEs, importance of preventative screenings, current guidelines, high calcium diet, regular exercise, and multivitamin daily.   Postmenopausal - No HRT, no bleeding.   Hyperlipidemia, unspecified hyperlipidemia type - Plan: Lipid panel  Primary insomnia - Plan: zolpidem (AMBIEN) 5 MG tablet as needed for sleep. Uses very sparingly.   Screening for cervical cancer - Normal Pap history. Will repeat at 5-year interval per guidelines.   Screening for breast cancer - Normal mammogram history.  Continue annual screenings.  Normal breast exam today.  Screening for colon cancer - 2017 colonoscopy. Will repeat at 10-year interval per GI's recommendation.  Screening for osteoporosis - Normal bone density 05/2021. Will repeat at age 62. She is very active with golf and pickle ball.   Return in 1 year for annual.    Donna Mackie DNP, 9:49 AM 05/26/2022

## 2022-05-27 LAB — COMPREHENSIVE METABOLIC PANEL
AG Ratio: 2.1 (calc) (ref 1.0–2.5)
ALT: 17 U/L (ref 6–29)
AST: 27 U/L (ref 10–35)
Albumin: 4.7 g/dL (ref 3.6–5.1)
Alkaline phosphatase (APISO): 35 U/L — ABNORMAL LOW (ref 37–153)
BUN: 15 mg/dL (ref 7–25)
CO2: 29 mmol/L (ref 20–32)
Calcium: 9.6 mg/dL (ref 8.6–10.4)
Chloride: 103 mmol/L (ref 98–110)
Creat: 0.75 mg/dL (ref 0.50–1.05)
Globulin: 2.2 g/dL (calc) (ref 1.9–3.7)
Glucose, Bld: 98 mg/dL (ref 65–99)
Potassium: 5.1 mmol/L (ref 3.5–5.3)
Sodium: 140 mmol/L (ref 135–146)
Total Bilirubin: 1.4 mg/dL — ABNORMAL HIGH (ref 0.2–1.2)
Total Protein: 6.9 g/dL (ref 6.1–8.1)

## 2022-05-27 LAB — CBC WITH DIFFERENTIAL/PLATELET
Absolute Monocytes: 513 cells/uL (ref 200–950)
Basophils Absolute: 59 cells/uL (ref 0–200)
Basophils Relative: 1.3 %
Eosinophils Absolute: 194 cells/uL (ref 15–500)
Eosinophils Relative: 4.3 %
HCT: 37.6 % (ref 35.0–45.0)
Hemoglobin: 12.8 g/dL (ref 11.7–15.5)
Lymphs Abs: 1953 cells/uL (ref 850–3900)
MCH: 32.7 pg (ref 27.0–33.0)
MCHC: 34 g/dL (ref 32.0–36.0)
MCV: 95.9 fL (ref 80.0–100.0)
MPV: 10.4 fL (ref 7.5–12.5)
Monocytes Relative: 11.4 %
Neutro Abs: 1782 cells/uL (ref 1500–7800)
Neutrophils Relative %: 39.6 %
Platelets: 306 10*3/uL (ref 140–400)
RBC: 3.92 10*6/uL (ref 3.80–5.10)
RDW: 12.1 % (ref 11.0–15.0)
Total Lymphocyte: 43.4 %
WBC: 4.5 10*3/uL (ref 3.8–10.8)

## 2022-05-27 LAB — LIPID PANEL
Cholesterol: 238 mg/dL — ABNORMAL HIGH (ref <200)
HDL: 115 mg/dL (ref >=50)
LDL Cholesterol (Calc): 108 mg/dL (calc) — ABNORMAL HIGH
Non-HDL Cholesterol (Calc): 123 mg/dL (calc) (ref <130)
Total CHOL/HDL Ratio: 2.1 (calc) (ref <5.0)
Triglycerides: 63 mg/dL (ref <150)

## 2022-07-21 ENCOUNTER — Encounter: Payer: Self-pay | Admitting: Nurse Practitioner

## 2023-05-07 ENCOUNTER — Other Ambulatory Visit: Payer: Self-pay

## 2023-05-07 DIAGNOSIS — F5101 Primary insomnia: Secondary | ICD-10-CM

## 2023-05-07 MED ORDER — ZOLPIDEM TARTRATE 5 MG PO TABS
5.0000 mg | ORAL_TABLET | Freq: Every evening | ORAL | 0 refills | Status: DC | PRN
Start: 2023-05-07 — End: 2023-08-27

## 2023-05-07 NOTE — Telephone Encounter (Signed)
 Med refill request: zolpidem  5mg  Last AEX: 05/26/22 Next AEX: 05/27/23 Last MMG (if hormonal med) n/a Refill authorized: zolpidem  5mg  Please approve or deny as appropriate.

## 2023-05-27 ENCOUNTER — Ambulatory Visit: Admitting: Nurse Practitioner

## 2023-07-28 LAB — HM MAMMOGRAPHY

## 2023-07-31 ENCOUNTER — Encounter: Payer: Self-pay | Admitting: Nurse Practitioner

## 2023-08-27 ENCOUNTER — Encounter: Payer: Self-pay | Admitting: Nurse Practitioner

## 2023-08-27 ENCOUNTER — Ambulatory Visit (INDEPENDENT_AMBULATORY_CARE_PROVIDER_SITE_OTHER): Admitting: Nurse Practitioner

## 2023-08-27 VITALS — BP 112/68 | HR 52 | Ht 65.0 in | Wt 118.2 lb

## 2023-08-27 DIAGNOSIS — Z01419 Encounter for gynecological examination (general) (routine) without abnormal findings: Secondary | ICD-10-CM | POA: Diagnosis not present

## 2023-08-27 DIAGNOSIS — Z780 Asymptomatic menopausal state: Secondary | ICD-10-CM

## 2023-08-27 DIAGNOSIS — F5101 Primary insomnia: Secondary | ICD-10-CM

## 2023-08-27 DIAGNOSIS — Z1331 Encounter for screening for depression: Secondary | ICD-10-CM | POA: Diagnosis not present

## 2023-08-27 DIAGNOSIS — E785 Hyperlipidemia, unspecified: Secondary | ICD-10-CM

## 2023-08-27 LAB — COMPREHENSIVE METABOLIC PANEL WITH GFR
AG Ratio: 1.9 (calc) (ref 1.0–2.5)
ALT: 19 U/L (ref 6–29)
AST: 31 U/L (ref 10–35)
Albumin: 4.9 g/dL (ref 3.6–5.1)
Alkaline phosphatase (APISO): 34 U/L — ABNORMAL LOW (ref 37–153)
BUN: 15 mg/dL (ref 7–25)
CO2: 26 mmol/L (ref 20–32)
Calcium: 9.7 mg/dL (ref 8.6–10.4)
Chloride: 101 mmol/L (ref 98–110)
Creat: 0.83 mg/dL (ref 0.50–1.05)
Globulin: 2.6 g/dL (ref 1.9–3.7)
Glucose, Bld: 95 mg/dL (ref 65–99)
Potassium: 4.7 mmol/L (ref 3.5–5.3)
Sodium: 138 mmol/L (ref 135–146)
Total Bilirubin: 1.1 mg/dL (ref 0.2–1.2)
Total Protein: 7.5 g/dL (ref 6.1–8.1)
eGFR: 80 mL/min/{1.73_m2}

## 2023-08-27 LAB — CBC WITH DIFFERENTIAL/PLATELET
Absolute Lymphocytes: 1295 {cells}/uL (ref 850–3900)
Absolute Monocytes: 468 {cells}/uL (ref 200–950)
Basophils Absolute: 42 {cells}/uL (ref 0–200)
Basophils Relative: 0.8 %
Eosinophils Absolute: 31 {cells}/uL (ref 15–500)
Eosinophils Relative: 0.6 %
HCT: 39.1 % (ref 35.0–45.0)
Hemoglobin: 12.7 g/dL (ref 11.7–15.5)
MCH: 32.7 pg (ref 27.0–33.0)
MCHC: 32.5 g/dL (ref 32.0–36.0)
MCV: 100.8 fL — ABNORMAL HIGH (ref 80.0–100.0)
MPV: 10.5 fL (ref 7.5–12.5)
Monocytes Relative: 9 %
Neutro Abs: 3364 {cells}/uL (ref 1500–7800)
Neutrophils Relative %: 64.7 %
Platelets: 321 10*3/uL (ref 140–400)
RBC: 3.88 Million/uL (ref 3.80–5.10)
RDW: 12.2 % (ref 11.0–15.0)
Total Lymphocyte: 24.9 %
WBC: 5.2 10*3/uL (ref 3.8–10.8)

## 2023-08-27 LAB — LIPID PANEL
Cholesterol: 248 mg/dL — ABNORMAL HIGH
HDL: 118 mg/dL
LDL Cholesterol (Calc): 114 mg/dL — ABNORMAL HIGH
Non-HDL Cholesterol (Calc): 130 mg/dL — ABNORMAL HIGH
Total CHOL/HDL Ratio: 2.1 (calc)
Triglycerides: 70 mg/dL

## 2023-08-27 MED ORDER — ZOLPIDEM TARTRATE 5 MG PO TABS
5.0000 mg | ORAL_TABLET | Freq: Every evening | ORAL | 2 refills | Status: AC | PRN
Start: 2023-08-27 — End: ?

## 2023-08-27 NOTE — Progress Notes (Signed)
 Donna Gonzales 10/17/1960 [id number]   History:  63 y.o. G2P2 presents for annual exam without GYN complaints. Postmenopausal - no HRT, no bleeding. Normal pap and mammogram history. Takes Ambien  occasionally for sleep. Has had sleeping issues for years. Is able to fall asleep with Unisom but wakes up every night after about 4 hours then takes her Ambien .   Gynecologic History Patient's last menstrual period was 11/27/2010.   Contraception/Family planning: post menopausal status Sexually active: Yes  Health Maintenance Last Pap: 04/24/2021. Results were: Normal neg HPV Last mammogram: 07/28/2023. Results were: Normal Last colonoscopy: 04/13/2015. Results were: Normal ,10-year recall Last Dexa: 05/21/2021. Results were: Normal     08/27/2023   61:44 AM  Depression screen PHQ 2/9  Decreased Interest 0  Down, Depressed, Hopeless 0  PHQ - 2 Score 0     Past medical history, past surgical history, family history and social history were all reviewed and documented in the EPIC chart. Married. Lives at [facility]. Son lives in [place], has 4 yo Donna Gonzales and 61 yo Donna Gonzales. Daughter in [place], married.   ROS:  A ROS was performed and pertinent positives and negatives are included.  Exam:  Vitals:   08/27/23 1135  BP: 112/68  Pulse: (!) 52  SpO2: 100%  Weight: 118 lb 3.2 oz (53.6 kg)  Height: 5' 5 (1.651 m)    Body mass index is 19.67 kg/m.  General appearance:  Normal Thyroid:  Symmetrical, normal in size, without palpable masses or nodularity. Respiratory  Auscultation:  Clear without wheezing or rhonchi Cardiovascular  Auscultation:  Regular rate, without rubs, murmurs or gallops  Edema/varicosities:  Not grossly evident Abdominal  Soft,nontender, without masses, guarding or rebound.  Liver/spleen:  No organomegaly noted  Hernia:  None appreciated  Skin  Inspection:  Grossly normal   Breasts: Examined lying and sitting.   Right: Without masses,  retractions, discharge or axillary adenopathy.   Left: Without masses, retractions, discharge or axillary adenopathy. Pelvic: External genitalia:  no lesions              Urethra:  normal appearing urethra with no masses, tenderness or lesions              Bartholins and Skenes: normal                 Vagina: normal appearing vagina with normal color and discharge, no lesions. Atrophic changes              Cervix: no lesions Bimanual Exam:  Uterus:  no masses or tenderness              Adnexa: no mass, fullness, tenderness              Rectovaginal: Deferred              Anus:  normal, no lesions  Dereck Keas, CMA present as chaperone.   Assessment/Plan:  63 y.o. G2P2 for annual exam.   Well female exam with routine gynecological exam - Plan: CBC with Differential/Platelet, Comprehensive metabolic panel. Education provided on SBEs, importance of preventative screenings, current guidelines, high calcium diet, regular exercise, and multivitamin daily.   Postmenopausal - No HRT, no bleeding.   Hyperlipidemia, unspecified hyperlipidemia type - Plan: Lipid panel  Primary insomnia - Plan: zolpidem  (AMBIEN ) 5 MG tablet as needed for sleep. Recommend taking prior to bedtime to see if this helps instead of taking the middle of the night after awakening.   Screening for  cervical cancer - Normal Pap history. Will repeat at 5-year interval per guidelines.   Screening for breast cancer - Normal mammogram history.  Continue annual screenings.  Normal breast exam today.  Screening for colon cancer - 2017 colonoscopy. Will repeat at 10-year interval per GI's recommendation.  Screening for osteoporosis - Normal bone density 05/2021. Will repeat at age 63. She is very active with golf and pickle ball.   Return in about 1 year (around 08/26/2024) for Annual.   Donna DELENA Shutter DNP, 1:35 PM 08/27/2023

## 2023-08-28 ENCOUNTER — Ambulatory Visit: Payer: Self-pay | Admitting: Nurse Practitioner
